# Patient Record
Sex: Female | Born: 1995 | Race: Black or African American | Hispanic: No | Marital: Single | Smoking: Never smoker
Health system: Southern US, Community
[De-identification: ages and names within clinical notes are randomized; demographics above are authoritative.]

## PROBLEM LIST (undated history)

## (undated) ENCOUNTER — Ambulatory Visit: Admission: EM | Source: Home / Self Care

## (undated) DIAGNOSIS — D649 Anemia, unspecified: Secondary | ICD-10-CM

## (undated) DIAGNOSIS — E119 Type 2 diabetes mellitus without complications: Secondary | ICD-10-CM

## (undated) DIAGNOSIS — R7303 Prediabetes: Secondary | ICD-10-CM

## (undated) DIAGNOSIS — H4710 Unspecified papilledema: Secondary | ICD-10-CM

## (undated) DIAGNOSIS — G43909 Migraine, unspecified, not intractable, without status migrainosus: Secondary | ICD-10-CM

## (undated) DIAGNOSIS — F32A Depression, unspecified: Secondary | ICD-10-CM

## (undated) DIAGNOSIS — R51 Headache: Secondary | ICD-10-CM

## (undated) DIAGNOSIS — E669 Obesity, unspecified: Secondary | ICD-10-CM

## (undated) DIAGNOSIS — F329 Major depressive disorder, single episode, unspecified: Secondary | ICD-10-CM

## (undated) HISTORY — DX: Major depressive disorder, single episode, unspecified: F32.9

## (undated) HISTORY — DX: Migraine, unspecified, not intractable, without status migrainosus: G43.909

## (undated) HISTORY — DX: Unspecified papilledema: H47.10

## (undated) HISTORY — DX: Obesity, unspecified: E66.9

## (undated) HISTORY — DX: Anemia, unspecified: D64.9

## (undated) HISTORY — DX: Depression, unspecified: F32.A

## (undated) NOTE — *Deleted (*Deleted)
 .     Contraceptive Efficacy  The chart below compares typical effectiveness of contraceptive methods, starting with the most effective methods. A great resource for communicating with patients (also available as a Government social research officer PDF here: Contraceptive Effectiveness).  Check this table: Contraceptive Failure Rates for the most up-to-date estimates of contraceptive efficacy, method by method

---
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
## 2013-02-04 ENCOUNTER — Encounter (HOSPITAL_COMMUNITY): Payer: Self-pay | Admitting: *Deleted

## 2013-02-04 ENCOUNTER — Emergency Department (INDEPENDENT_AMBULATORY_CARE_PROVIDER_SITE_OTHER)
Admission: EM | Admit: 2013-02-04 | Discharge: 2013-02-04 | Disposition: A | Discharge status: Home / Self Care | Payer: Medicaid Other | Source: Home / Self Care

## 2013-02-04 DIAGNOSIS — M778 Other enthesopathies, not elsewhere classified: Secondary | ICD-10-CM

## 2013-02-04 DIAGNOSIS — M65849 Other synovitis and tenosynovitis, unspecified hand: Secondary | ICD-10-CM

## 2013-02-04 HISTORY — DX: Type 2 diabetes mellitus without complications: E11.9

## 2013-02-04 NOTE — ED Notes (Signed)
Pt  Reports  Pain and  Numbness  r  Arm  X  3  Weeks    Also  Reports  Pain l  Arm    Today  denys  Any injury  denys  Any  Neck pain     She  Is  A  Diabetic  -  Pt  Reports  She works   2  Jobs  And  Uses  Her hands  A  Lot         She  Ambulated  To  Room  With  Steady  Fluid  Gait

## 2013-02-04 NOTE — ED Notes (Signed)
Universal  r  Wrist  Splint applied  

## 2013-02-04 NOTE — ED Provider Notes (Signed)
History     CSN: [id number]  Arrival date & time 02/04/13  1258   First MD Initiated Contact with Patient 02/04/13 1347      Chief Complaint  Patient presents with  . Extremity Pain    (Consider location/radiation/quality/duration/timing/severity/associated sxs/prior treatment) HPI Comments: 17 year old female presents with right forearm and wrist pain. She has a job that requires repetitive movement for long hours at a time. This type of movement produces and exacerbates her pain. The pain is located along the volar aspect of the wrist and the radial aspect of the hand and wrist. Denies blunt trauma or other injuries. Occasionally there is intermittent numbness distally.   Past Medical History  Diagnosis Date  . Headache   . Prediabetes   . Hypertension   . Diabetes mellitus without complication     History reviewed. No pertinent past surgical history.  Family History  Problem Relation Age of Onset  . Diabetes Mother     History  Substance Use Topics  . Smoking status: Never Smoker   . Smokeless tobacco: Not on file  . Alcohol Use: No    OB History   Grav Para Term Preterm Abortions TAB SAB Ect Mult Living                  Review of Systems  Constitutional: Negative for fever, chills and activity change.  HENT: Negative.   Respiratory: Negative.   Cardiovascular: Negative.   Musculoskeletal:       As per HPI  Skin: Negative for color change, pallor and rash.  Neurological: Positive for numbness.    Allergies  Review of patient's allergies indicates no known allergies.  Home Medications   Current Outpatient Rx  Name  Route  Sig  Dispense  Refill  . ENALAPRIL MALEATE PO   Oral   Take by mouth.         . METFORMIN HCL PO   Oral   Take by mouth.         Bertram Gala Glycol-Propyl Glycol (SYSTANE) 0.4-0.3 % SOLN   Ophthalmic   Apply 1 drop to eye 4 (four) times daily as needed.   5 mL   0     BP 101/60  Pulse 66  Temp(Src) 98.5 F (36.9  C) (Oral)  Resp 16  SpO2 100%  LMP 01/22/2013  Physical Exam  Constitutional: She is oriented to person, place, and time. She appears well-developed and well-nourished. No distress.  HENT:  Head: Normocephalic and atraumatic.  Eyes: EOM are normal. Pupils are equal, round, and reactive to light.  Neck: Normal range of motion. Neck supple.  Musculoskeletal:  Tenderness over the volar/flexor surface of the distal wrist. Palpation of the individual flexor tendons produces tenderness. Flexion of the wrist against resistance also produces pain. Positive Finkelstein's test and tenderness over the extensor pollicis longus tendon. No swelling, deformity or discoloration. Distal neurovascular and motor sensory is otherwise intact. Radial pulse 2+.  Lymphadenopathy:    She has no cervical adenopathy.  Neurological: She is alert and oriented to person, place, and time. No cranial nerve deficit.  Skin: Skin is warm and dry.  Psychiatric: She has a normal mood and affect.    ED Course  Procedures (including critical care time)  Labs Reviewed - No data to display No results found.   1. Tendinitis of right wrist   2. Tendinitis of thumb       MDM  More specifically diagnosis with flexor carpi radialis and  flexor carpi ulnaris tendinitis. Also tendinitis of the extensor pollicis longus. Wear the wrist splint for the next 2-3 weeks. Apply ice off and on several times during the day Ibuprofen 400 600 mg every 6-8 hours when necessary pain. Followup with her primary care doctor.   If you do not have one will need to locate as soon as possible for minor illnesses and health maintenance care.      Hayden Rasmussen, NP 02/04/13 1521

## 2013-02-04 NOTE — Discharge Instructions (Signed)
Extensor Pollicis Longus Tendinitis Extensor pollicis longus (EPL) tendonitis is a condition in which the EPL tendon lining (sheath) becomes inflamed. This causes pain on the thumb side (radial side) of the back of the wrist. The EPL tendon attaches the EPL muscle to the bone. The EPL muscle straightens the thumb. The tendon lining secretes a lubricant that allows the EPL to glide smoothly. When the lining becomes inflamed, the tendon can not glide smoothly, which causes pain. There are three grades of EPL tendonitis. A grade 1 strain is a mild strain, where the tendon experiences a slight pull, but no obvious tearing (microscopic tearing). There is no loss of strength, and the tendon is the correct length. Grade 2 strains are a moderate strain. There is tearing of tendon fibers within the tendon or where the tendon meets the bone or muscle. Tearing of the tendon causes the length of the whole muscle-tendon-bone unit to increase. A grade 2 injury usually results in decreased strength. A grade 3 strain is a complete rupture of the tendon.  CAUSES   EPL tendinitis is often an overuse injury and caused by repeated motions of the hand and wrist, due to friction of the tendon within the lining.  Sudden increase in activity or change in activity. SYMPTOMS   Vague, diffuse pain and tenderness over the thumb side of the back of the wrist.  Pain that gets worse when straightening the thumb.  Locking, catching, or triggering of the thumb joint.  Limited motion of the thumb.  Little or no swelling, warmth, or redness of the back of the wrist.  Crackling sound (crepitation) when the tendon or wrist is moved or touched. RISK INCREASES WITH:  Sports that involve repeated hand and wrist motions (tennis, golf, bowling).  Previous wrist injury.  Heavy labor.  Poor wrist strength and flexibility.  Failure to warm up properly before activity. PREVENTION   Warm up and stretch properly before  activity.  Allow time for adequate rest and recovery between practices and competition.  Maintain physical fitness:  Forearm, wrist, and hand flexibility.  Muscle strength and endurance.  Learn and use proper exercise technique. PROGNOSIS  This condition is often curable within 6 weeks, if treated properly with non-surgical treatment and resting the affected area. Surgery may be advised. RELATED COMPLICATIONS   Longer healing time, if not properly treated, or if not given adequate time to heal.  Chronic inflammation of the EPL tendon, causing pain.  Recurring of symptoms, especially if activity is resumed too soon.  Risks of surgery: infection, bleeding, injury to nerves (numbness of the thumb), continued pain, incomplete release of the tendon lining, recurring symptoms, cutting of the tendon, thumb weakness, thumb stiffness, and inability to straighten the thumb. TREATMENT  Treatment first involves ice and medicine to reduce pain and inflammation. Stretching and strengthening exercises, along with activity modification, may be advised to reduce painful symptoms. For certain cases, a brace, splint, or taping may be advised, to reduce wrist movement during activity. Your caregiver may recommend a corticosteroid injection to reduce inflammation. If non-surgical treatment is unsuccessful, surgery may be advised to release the inflamed tendon. If the tendon is torn, it may require repair or replacement (reconstruction). MEDICATION   If pain medicine is needed, nonsteroidal anti-inflammatory medicines, (aspirin and ibuprofen), or other minor pain relievers (acetaminophen), are often advised.  Do not take pain medicine for 7 days before surgery.  Prescription pain relievers are usually only prescribed after surgery. Use only as directed and only  as much as you need.  Cortisone injections reduce inflammation. However, these are used only in extreme cases, because there is a limit to the  number of times they may be given. These injections may weaken muscle and tendon tissue. Anesthetics also temporarily relieve pain. COLD THERAPY  Cold treatment (icing) relieves pain and reduces inflammation. Cold treatment should be applied for 10 to 15 minutes every 2 to 3 hours, and immediately after activity that aggravates your symptoms. Use ice packs or an ice massage. SEEK MEDICAL CARE IF:   Symptoms get worse or do not improve in 2 to 4 weeks, despite treatment.  You experience pain, numbness, or coldness in the hand.  Blue, gray, or dark color appears in the fingernails.  Any of the following occur after surgery: increased pain, swelling, redness, drainage of fluids, bleeding in the affected area, or signs of infection.  New, unexplained symptoms develop. (Drugs used in treatment may produce side effects.) Document Released: 10/29/2005 Document Revised: 01/21/2012 Document Reviewed: 02/10/2009 [facility] Patient Information 2013 [place], [place].  Repetitive Strain Injuries Repetitive strain injuries (RSIs) result from overuse or misuse of soft tissues including muscles, tendons, or nerves. Tendons are the cord-like structures that attach muscles to bones. RSIs can affect almost any part of the body. However, RSIs are most common in the arms (thumbs, wrists, elbows, shoulders) and legs (ankles, knees). Common medical conditions that are often caused by repetitive strain include carpal tunnel syndrome, tennis or golfer's elbow, bursitis, and tendonitis. If RSIs are treated early, and therepeated activity is reduced or removed, the severity and length of your problems can usually be reduced. RSIs are also called cumulative trauma disorders (CTD).  CAUSES  Many RSIs occur due to repeating the same activity at work over weeks or months without sufficient rest, such as prolonged typing. RSIs also commonly occur when a hobby or sport is done repeatedly without sufficient rest. RSIs can also  occur due to repeated strain or stress on a body part in someone who has one or more risk factors for RSIs. RISK FACTORS Workplace risk factors  Frequent computer use, especially if your workstation is not adjusted for your body type.  Infrequent rest breaks.  Working in a high-pressure environment.  Working at a [employer].  Repeating the same motion, such as frequent typing.  Working in an awkward position or holding the same position for a long time.  Forceful movements such as lifting, pulling, or pushing.  Vibration caused by using power tools.  Working in cold temperatures.  Job stress. Personal risk factors  Poor posture.  Being loose-jointed.  Not exercising regularly.  Being overweight.  Arthritis, diabetes, thyroid problems, or other long-term (chronic)medical conditions.  Vitamin deficiencies.  Keeping your fingernails long.  An unhealthy, stressful, or inactive lifestyle.  Not sleeping well. SYMPTOMS  Symptoms often begin at work but become more noticeable after the repeated stress has ended. For example, you may develop fatigue or soreness in your wrist while typingat work, and at night you may develop numbness and tingling in your fingers. Common symptoms include:   Burning, shooting, or aching pain, especially in the fingers, palms, wrists, forearms, or shoulders.  Tenderness.  Swelling.  Tingling, numbness, or loss of feeling.  Pain with certain activities, such as turning a doorknob or reaching above your head.  Weakness, heaviness, or loss of coordination in yourhand.  Muscle spasms or tightness. In some cases, symptoms can become so intense that it is difficult to perform everyday tasks.  Symptoms that do not improve with rest may indicate a more serious condition.  DIAGNOSIS  Your caregiver may determine the type ofRSI you have based on your medical evaluation and a description of your activities.  TREATMENT  Treatment depends on  the severity and type of RSI you have. Your caregiver may recommend rest for the affected body part, medicines, and physical or occupational therapy to reduce pain, swelling, and soreness. Discuss the activities you do repeatedly with your caregiver. Your caregiver can help you decide whether you need to change your activities. An RSI may take months or years to heal, especially if the affected body part gets insufficient rest. In some cases, such as severe carpal tunnel syndrome, surgery may be recommended. PREVENTION  Talk with your supervisor to make sure you have the proper equipment for your work station.  Maintain good posture at your desk or work station with:  Feet flat on the floor.  Knees directly over the feet, bent at a right angle.  Lower back supported by your chair or a cushion in the curve of your lower back.  Shoulders and arms relaxed and at your sides.  Neck relaxed and not bent forwards or backwards.  Your desk and computer workstation properly adjusted to your body type.  Your chair adjusted so there is no excess pressure on the back of your thighs.  The keyboard resting above your thighs. You should be able to reach the keys with your elbows at your side, bent at a right angle. Your arms should be supported on forearm rests, with your forearms parallel to the ground.  The computer mouse within easy reach.  The monitor directly in front of you, so that your eyes are aligned with the top of the screen. The screen should be about 15 to 25 inches from your eyes.  While typing, keep your wrist straight, in a neutral position. Move your entire arm when you move your mouse or when typing hard-to-reach keys.  Only use your computer as much as you need to for work. Do not use it during breaks.  Take breaks often from any repeated activity. Alternate with another task which requires you to use different muscles, or rest at least once every hour.  Change positions  regularly. If you spend a lot of time sitting, get up, walk around, and stretch.  Do not hold pens or pencils tightly when writing.  Exercise regularly.  Maintain a normal weight.  Eat a diet with plenty of vegetables, whole grains, and fruit.  Get sufficient, restful sleep. HOME CARE INSTRUCTIONS  If your caregiver prescribed medicine to help reduce swelling, take it as directed.  Only take over-the-counter or prescription medicines for pain, discomfort, or fever as directed by your caregiver.  Reduce, and if needed, stopthe activities that are causing your problems until you have no further symptoms.If your symptoms are work-related, you may need to talk to your supervisor about changing your activities.  When symptoms develop, put ice or a cold pack on the aching area.  Put ice in a plastic bag.  Place a towel between your skin and the bag.  Leave the ice on for 15 to 20 minutes.  If you were given a splint to keep your wrist from bending, wear it as instructed. It is important to wear the splint at night. Use the splint for as long as your caregiver recommends. SEEK MEDICAL CARE IF:  You develop new problems.  Your problems do not get better  with medicine. MAKE SURE YOU:  Understand these instructions.  Will watch your condition.  Will get help right away if you are not doing well or get worse. Document Released: 10/19/2002 Document Revised: 04/29/2012 Document Reviewed: 12/20/2011 [facility] Patient Information 2013 [place], [place].  Tendinitis Tendinitis is swelling and inflammation of the tendons. Tendons are band-like tissues that connect muscle to bone. Tendinitis commonly occurs in the:   Shoulders (rotator cuff).  Heels (Achilles tendon).  Elbows (triceps tendon). CAUSES Tendinitis is usually caused by overusing the tendon, muscles, and joints involved. When the tissue surrounding a tendon (synovium) becomes inflamed, it is called tenosynovitis.  Tendinitis commonly develops in people whose jobs require repetitive motions. SYMPTOMS  Pain.  Tenderness.  Mild swelling. DIAGNOSIS Tendinitis is usually diagnosed by physical exam. Your caregiver may also order X-rays or other imaging tests. TREATMENT Your caregiver may recommend certain medicines or exercises for your treatment. HOME CARE INSTRUCTIONS   Use a sling or splint for as long as directed by your caregiver until the pain decreases.  Put ice on the injured area.  Put ice in a plastic bag.  Place a towel between your skin and the bag.  Leave the ice on for 15 to 20 minutes, 3 to 4 times a day.  Avoid using the limb while the tendon is painful. Perform gentle range of motion exercises only as directed by your caregiver. Stop exercises if pain or discomfort increase, unless directed otherwise by your caregiver.  Only take over-the-counter or prescription medicines for pain, discomfort, or fever as directed by your caregiver. SEEK MEDICAL CARE IF:   Your pain and swelling increase.  You develop new, unexplained symptoms, especially increased numbness in the hands. MAKE SURE YOU:   Understand these instructions.  Will watch your condition.  Will get help right away if you are not doing well or get worse. Document Released: 10/26/2000 Document Revised: 01/21/2012 Document Reviewed: 01/15/2011 [facility] Patient Information 2013 [place], [place].  Flexor Carpi Ulnaris and [employer] Radialis Tendinitis with Rehab Tendonitis is a condition that involves inflammation of a tendon, which is a soft tissue that connects muscle to bone. The flexor carpi ulnaris (FCU) and flexor carpi radialis (FCR) tendons are vulnerable to tendonitis. The FCU and FCR are used for bending the wrist and gripping. FCU and FCR tendonitis may include inflammation of the tendon lining (sheath). The lining normally secretes fluid that allows the tendons to function smoothly, but when it is  inflamed, function is impaired. This condition may also include a tear in the FCU or FCR tendon or muscle (strain). The strain may be classified as grade 1 or 2. Grade 1 strains cause pain, but the tendon is not lengthened. Grade 2 strains include a lengthened ligament, due to the ligament being stretched or partially torn. With grade 2 strains, there is still function, although function may be decreased. SYMPTOMS   Pain, tenderness, swelling, warmth, or redness on the underside of the wrist.  Pain that gets worse when bending the wrist, especially against resistance or when turning the palm down against resistance.  Pain with gripping.  Limited motion of the wrist.  Crackling sound (crepitation) when the tendon or wrist is moved or touched.  Numbness in part of the palm of the hand. CAUSES  FCU and FCR tendonitis are caused by injury to the FCU and FCR tendons. This is often due to chronic or repeated injuries, but may also be due to severe (acute) injury. Common causes of injury include:  Strain from unusual use, overuse, increase in activity, or change in activity of the wrist, hand, or forearm.  Repeated motions of the hand and wrist, due to friction of the tendon within the lining (sheath). RISK INCREASES WITH:   Sports that involve repeated hand and wrist motions (i.e. golfing, bowling).  Sports that require gripping (i.e. tennis, golf, weightlifting).  Heavy labor.  Poor wrist and forearm strength and flexibility.  Failure to warm up properly before activity.  This condition is more common in women than in men. PREVENTION   Warm up and stretch properly before activity.  Allow the body to recover between activity.  Maintain physical fitness:  Strength, flexibility, and endurance.  Cardiovascular fitness.  Learn and use proper technique. PROGNOSIS  If treated properly, FCU and FCR tendonitis is usually curable with 6 weeks.  RELATED COMPLICATIONS   Longer  healing time, if not properly treated or if not given adequate time to heal.  Chronically inflamed tendon, causing persistent pain with activity, that may progress to constant pain, restriction of motion of the tendon within the sheath, and may potentially rupture the tendon.  Recurring symptoms, especially if activity is resumed too soon.  Risks of surgery: infection, bleeding, injury to nerves, continued pain, incomplete release of the tendon lining, recurring symptoms, cutting of the tendon, and weakness of the wrist and grip. TREATMENT  Treatment first involves the use of ice and medicine to reduce pain and inflammation Performing stretching and strengthening exercises regularly is important for a quick recovery. These exercises may be completed at home or with a therapist. Your caregiver may recommend the use of a brace or splint to reduce motions that aggravate symptoms. Corticosteroid injections may be recommended. If non-surgical treatment is unsuccessful, surgery may be necessary.  MEDICATION   If pain medicine is needed, nonsteroidal anti-inflammatory medicines (aspirin and ibuprofen), or other minor pain relievers (acetaminophen), are often advised.  Do not take pain medicine for 7 days before surgery.  Prescription pain relievers may be given if your caregiver thinks they are needed. Use only as directed and only as much as you need.  Cortisone injections may be given, to reduce inflammation. However, these injections should be reserved for serious cases, as they may only be given a certain number of times. COLD THERAPY  Cold treatment (icing) relieves pain and reduces inflammation. Cold treatment should be applied for 10 to 15 minutes every 2 to 3 hours, and immediately after activity that aggravates your symptoms. Use ice packs or an ice massage. SEEK MEDICAL CARE IF:   Symptoms get worse or do not improve in 2 weeks, despite treatment.  You experience pain, numbness, or  coldness in the hand.  Blue, gray, or dark color appears in the fingernails.  Any of the following occur after surgery: increased pain, swelling, redness, drainage of fluids, bleeding in the affected area, or signs of infection.  New, unexplained symptoms develop. (Drugs used in treatment may produce side effects.) EXERCISES  RANGE OF MOTION (ROM) AND STRETCHING EXERCISES - Flexor Carpi Ulnaris and Flexor Carpi Radialis Tendinitis These exercises may help you when beginning to recover from your injury. Your symptoms may go away with or without further involvement from your physician, physical therapist or athletic trainer. While completing these exercises, remember:   Restoring tissue flexibility helps normal motion to return to the joints. This allows healthier, less painful movement and activity.  An effective stretch should be held for at least 30 seconds.  A stretch should never  be painful. You should only feel a gentle lengthening or release in the stretched tissue. RANGE OF MOTION  Wrist Flexion, Active-Assisted  Extend your right / left elbow with your fingers pointing down.*  Gently pull the back of your hand towards you, until you feel a gentle stretch on the top of your forearm.  Hold this position for __________ seconds. Repeat __________ times. Complete this exercise __________ times per day.  *If directed by your physician, physical therapist or athletic trainer, complete this stretch with your elbow bent, rather than extended. RANGE OF MOTION  Wrist Extension, Active-Assisted  Extend your right / left elbow and turn your palm upwards.*  Gently pull your palm and fingertips back, so your wrist extends and your fingers point more toward the ground.  You should feel a gentle stretch on the inside of your forearm.  Hold this position for __________ seconds. Repeat __________ times. Complete this exercise __________ times per day. *If directed by your physician, physical  therapist or athletic trainer, complete this stretch with your elbow bent, rather than extended. STRETCH - Wrist Flexion   Place the back of your right / left hand on a tabletop, leaving your elbow slightly bent. Your fingers should point away from your body.  Gently press the back of your hand down onto the table by straightening your elbow. You should feel a stretch on the top of your forearm.  Hold this position for __________ seconds. Repeat __________ times. Complete this stretch __________ times per day.  STRETCH  Wrist Extension  Place your right / left fingertips on a tabletop leaving your elbow slightly bent. Your fingers should point backwards.  Gently press your fingers and palm down onto the table by straightening your elbow. You should feel a stretch on the inside of your forearm.  Hold this position for __________ seconds. Repeat __________ times. Complete this stretch __________ times per day.  STRENGTHENING EXERCISES - Flexor Carpi Ulnaris and Flexor Carpi Radialis Tendinitis These exercises may help you when beginning to recover from your injury. They may resolve your symptoms with or without further involvement from your physician, physical therapist or athletic trainer. While completing these exercises, remember:   Muscles can gain both the endurance and the strength needed for everyday activities through controlled exercises.  Complete these exercises as instructed by your physician, physical therapist or athletic trainer. Increase the resistance and repetitions only as guided.  You may experience muscle soreness or fatigue, but the pain or discomfort you are trying to eliminate should never worsen during these exercises. If this pain does worsen, stop and make certain you are following the directions exactly. If the pain is still present after adjustments, discontinue the exercise until you can discuss the trouble with your clinician. STRENGTH Wrist Flexors  Sit with  your right / left forearm palm-up and fully supported on a table. Your elbow should be resting below the height of your shoulder. Allow your wrist to extend over the edge of the surface.  Loosely holding a __________ weight, or holding a piece of rubber exercise band or tubing in both hands, slowly curl your hand up toward your forearm.  Hold this position for __________ seconds. Slowly lower the wrist back to the starting position, in a controlled manner. Repeat __________ times. Complete this exercise __________ times per day.  STRENGTH  Wrist Extensors  Sit with your right / left forearm palm-down and fully supported on a table. Your elbow should be resting below the height of  your shoulder Allow your wrist to extend over the edge of the surface.  Loosely holding a __________ weight, or holding a piece of rubber exercise band or tubing in both hands, slowly curl your hand up toward your forearm.  Hold this position for __________ seconds. Slowly lower the wrist back to the starting position, in a controlled manner. Repeat __________ times. Complete this exercise __________ times per day.  STRENGTH - Ulnar Deviators  Stand with a ____________________ weight in your right / left hand, or sit while holding onto a rubber exercise band or tubing in both hands, with your healthy arm supported on a table, and your injured arm below the table.  Move only your right / left wrist, so that your pinkie travels toward your forearm and your thumb moves away from your forearm.  Hold this position for __________ seconds and then slowly lower the wrist back to the starting position. Repeat __________ times. Complete this exercise __________ times per day STRENGTH - Radial Deviators  Stand with a ____________________ weight in your right / left hand, or sit while holding onto a rubber exercise band or tubing in both hands, with your right / left arm supported on a table, and your healthy arm below the  table.  Raise your right / left hand upward in front of you, so that your thumb moves toward your forearm.  Hold this position for __________ seconds and then slowly lower the wrist back to the starting position. Repeat __________ times. Complete this exercise __________ times per day. STRENGTH - Grip   Grasp a tennis ball, a dense sponge, or a large, rolled sock in your hand.  Squeeze as hard as you can, without increasing any pain.  Hold this position for __________ seconds. Release your grip slowly. Repeat __________ times. Complete this exercise __________ times per day.  Document Released: 10/29/2005 Document Revised: 01/21/2012 Document Reviewed: 02/10/2009 [facility] Patient Information 2013 [place], [place].

## 2013-02-05 NOTE — ED Provider Notes (Signed)
Medical screening examination/treatment/procedure(s) were performed by non-physician practitioner and as supervising physician I was immediately available for consultation/collaboration.   [facility]; MD  Sharin Grave, MD 02/05/13 1022

## 2013-03-03 ENCOUNTER — Encounter: Payer: Self-pay | Admitting: Obstetrics

## 2013-03-11 ENCOUNTER — Ambulatory Visit: Payer: Self-pay | Admitting: Obstetrics & Gynecology

## 2013-03-16 ENCOUNTER — Encounter: Payer: Self-pay | Admitting: Neurology

## 2013-03-16 ENCOUNTER — Ambulatory Visit (INDEPENDENT_AMBULATORY_CARE_PROVIDER_SITE_OTHER): Payer: Medicaid Other | Admitting: Neurology

## 2013-03-16 VITALS — BP 118/74 | HR 79 | Ht 65.0 in | Wt 236.0 lb

## 2013-03-16 DIAGNOSIS — H4710 Unspecified papilledema: Secondary | ICD-10-CM

## 2013-03-16 NOTE — Progress Notes (Signed)
Reason for visit: Papilledema  Melissa Chang is a 17 y.o. female  History of present illness:  Melissa Chang is an 17 year old right-handed black female with a history of obesity. The patient has borderline diabetes, and she is on metformin for this. The patient went for a routine ophthalmologic evaluation on 03/12/2013. The patient was found to have mild papilledema through retinal tomography. The patient has not had any symptoms of headache, neck stiffness, muffled hearing, or pulsatile tinnitus. The patient denies any significant issues with balance or control the bowels or the bladder. The patient does have some occasional tingling in the hands, and she reports that she is weak all over. The patient does not have any double vision or loss of vision. The patient is sent to this office for an evaluation.  Past Medical History  Diagnosis Date  . Headache   . Prediabetes   . Hypertension   . Diabetes mellitus without complication   . Papilledema     Bilateral  . Obesity     History reviewed. No pertinent past surgical history.  Family History  Problem Relation Age of Onset  . Diabetes Mother   . Diabetes Sister   . Diabetes Maternal Aunt   . Hypertension Maternal Grandmother   . Diabetes Maternal Grandmother     Social history:  reports that she has never smoked. She does not have any smokeless tobacco history on file. She reports that she does not drink alcohol. Her drug history is not on file.  Medications:  Current Outpatient Prescriptions on File Prior to Visit  Medication Sig Dispense Refill  . ENALAPRIL MALEATE PO Take by mouth.      Bertram Gala Glycol-Propyl Glycol (SYSTANE) 0.4-0.3 % SOLN Apply 1 drop to eye 4 (four) times daily as needed.  5 mL  0   No current facility-administered medications on file prior to visit.    Allergies: No Known Allergies  ROS:  Out of a complete 14 system review of symptoms, the patient complains only of the following symptoms, and  all other reviewed systems are negative.  Fatigue Blurred vision Headache Numbness, weakness Depression, anxiety, insomnia, change in appetite, racing thoughts  Blood pressure 118/74, pulse 79, height 5\' 5"  (1.651 m), weight 236 lb (107.049 kg).  Physical Exam  General: The patient is alert and cooperative at the time of the examination. The patient is moderately obese.  Head: Pupils are equal, round, and reactive to light. Discs are flat bilaterally.  Neck: The neck is supple, no carotid bruits are noted.  Respiratory: The respiratory examination is clear.  Cardiovascular: The cardiovascular examination reveals a regular rate and rhythm, no obvious murmurs or rubs are noted.  Skin: Extremities are without significant edema.  Neurologic Exam  Mental status:  Cranial nerves: Facial symmetry is present. There is good sensation of the face to pinprick and soft touch bilaterally. The strength of the facial muscles and the muscles to head turning and shoulder shrug are normal bilaterally. Speech is well enunciated, no aphasia or dysarthria is noted. Extraocular movements are full. Visual fields are full.  Motor: The motor testing reveals 5 over 5 strength of all 4 extremities. Good symmetric motor tone is noted throughout.  Sensory: Sensory testing is intact to pinprick, soft touch, vibration sensation, and position sense on all 4 extremities. No evidence of extinction is noted.  Coordination: Cerebellar testing reveals good finger-nose-finger and heel-to-shin bilaterally.  Gait and station: Gait is normal. Tandem gait is normal. Romberg is negative.  No drift is seen.  Reflexes: Deep tendon reflexes are symmetric, but are depressed bilaterally. Toes are downgoing bilaterally.   Assessment/Plan:  1. Bilateral papilledema  2. Obesity  3. Borderline diabetes  4. Bipolar disorder  Clinical examination does not show definite papilledema, but this issue was picked up on  retinal tomography. The patient may have pre-clinical pseudotumor cerebri. The patient will be sent for MRI evaluation of the brain, and if this is unremarkable, a lumbar puncture will be performed. The patient will otherwise followup in 4 months. I have advised this patient to lose weight.  Marlan Palau MD 03/16/2013 7:30 PM  [facility] [address] [place], [place] [address]  Phone [phone] Fax [phone]

## 2013-03-18 ENCOUNTER — Ambulatory Visit: Payer: Self-pay | Admitting: Obstetrics & Gynecology

## 2013-03-23 ENCOUNTER — Ambulatory Visit: Payer: Medicaid Other | Admitting: Neurology

## 2013-03-30 ENCOUNTER — Ambulatory Visit
Admission: RE | Admit: 2013-03-30 | Discharge: 2013-03-30 | Disposition: A | Discharge status: Home / Self Care | Payer: Medicaid Other | Source: Ambulatory Visit | Attending: Neurology | Admitting: Neurology

## 2013-03-30 DIAGNOSIS — H4710 Unspecified papilledema: Secondary | ICD-10-CM

## 2013-03-30 MED ORDER — GADOBENATE DIMEGLUMINE 529 MG/ML IV SOLN
20.0000 mL | Freq: Once | INTRAVENOUS | Status: AC | PRN
  Administered 2013-03-30: 20 mL via INTRAVENOUS

## 2013-04-01 ENCOUNTER — Encounter: Payer: Self-pay | Admitting: Obstetrics & Gynecology

## 2013-04-01 ENCOUNTER — Ambulatory Visit (INDEPENDENT_AMBULATORY_CARE_PROVIDER_SITE_OTHER): Payer: Medicaid Other | Admitting: Obstetrics & Gynecology

## 2013-04-01 VITALS — BP 108/69 | HR 72 | Temp 98.0°F | Ht 64.0 in | Wt 231.0 lb

## 2013-04-01 DIAGNOSIS — Z3009 Encounter for other general counseling and advice on contraception: Secondary | ICD-10-CM

## 2013-04-01 DIAGNOSIS — Z3202 Encounter for pregnancy test, result negative: Secondary | ICD-10-CM

## 2013-04-01 LAB — POCT URINE PREGNANCY: Preg Test, Ur: NEGATIVE

## 2013-04-01 MED ORDER — MEDROXYPROGESTERONE ACETATE 150 MG/ML IM SUSP: 150.0000 mg | INTRAMUSCULAR | Status: DC

## 2013-04-01 NOTE — Patient Instructions (Signed)

## 2013-04-01 NOTE — Progress Notes (Signed)
.   Subjective:     Melissa Chang is a 17 y.o. female here for a birth control consult, she is interested in Depo Provera.   No current complaints.  Personal health questionnaire reviewed: no.   Gynecologic History Patient's last menstrual period was 03/24/2013. Contraception: none Last Pap: N/A Last mammogram: N/A  Obstetric History OB History   Grav Para Term Preterm Abortions TAB SAB Ect Mult Living                   The following portions of the patient's history were reviewed and updated as appropriate: allergies, current medications, past family history, past medical history, past social history, past surgical history and problem list.  Review of Systems Pertinent items are noted in HPI.    Objective:     No exam today   Assessment:    Contraceptive counseling   Plan:    Resume Depo Provera

## 2013-04-02 ENCOUNTER — Telehealth: Payer: Self-pay | Admitting: Neurology

## 2013-04-02 DIAGNOSIS — H4710 Unspecified papilledema: Secondary | ICD-10-CM

## 2013-04-02 NOTE — Telephone Encounter (Signed)
I called patient. The MRI the brain was unremarkable. We will get her set up for a lumbar puncture.

## 2013-04-09 ENCOUNTER — Telehealth: Payer: Self-pay | Admitting: Nurse Practitioner

## 2013-04-09 ENCOUNTER — Other Ambulatory Visit: Payer: Self-pay | Admitting: Neurology

## 2013-04-09 DIAGNOSIS — G932 Benign intracranial hypertension: Secondary | ICD-10-CM

## 2013-04-09 NOTE — Telephone Encounter (Signed)
Patient mom is calling wanting to know what is an lumbar puncture and why is the patient having this done. Please call the mom and if no answer please leave her a detailed message with the answers about the LP. Please advise.

## 2013-04-09 NOTE — Telephone Encounter (Signed)
I called the mother. I went through the reasons why we are getting a spinal tap. The MRI of the brain was unremarkable, and the patient was found to have papilledema her ophthalmologist. We are looking for pseudotumor cerebri.

## 2013-04-14 ENCOUNTER — Telehealth: Payer: Self-pay | Admitting: Neurology

## 2013-04-14 ENCOUNTER — Ambulatory Visit
Admission: RE | Admit: 2013-04-14 | Discharge: 2013-04-14 | Disposition: A | Discharge status: Home / Self Care | Payer: Medicaid Other | Source: Ambulatory Visit | Attending: Neurology | Admitting: Neurology

## 2013-04-14 DIAGNOSIS — H4710 Unspecified papilledema: Secondary | ICD-10-CM

## 2013-04-14 DIAGNOSIS — G932 Benign intracranial hypertension: Secondary | ICD-10-CM

## 2013-04-14 DIAGNOSIS — Z3009 Encounter for other general counseling and advice on contraception: Secondary | ICD-10-CM

## 2013-04-14 LAB — CSF CELL COUNT WITH DIFFERENTIAL
RBC Count, CSF: 3 cu mm — ABNORMAL HIGH
WBC, CSF: 1 cu mm (ref 0–5)

## 2013-04-14 LAB — GLUCOSE, CSF: Glucose, CSF: 58 mg/dL (ref 43–76)

## 2013-04-14 MED ORDER — TOPIRAMATE 25 MG PO TABS: 25.0000 mg | ORAL_TABLET | Freq: Every day | ORAL | Status: DC

## 2013-04-14 NOTE — Telephone Encounter (Signed)
I called patient. The opening pressure for the lumbar puncture was 25. This is a mild elevation, but the patient likely had pseudotumor cerebri picked up pre-clinical state. I'll add low-dose Topamax at night. I discussed this with the patient.

## 2013-04-15 ENCOUNTER — Telehealth: Payer: Self-pay | Admitting: *Deleted

## 2013-04-15 ENCOUNTER — Telehealth: Payer: Self-pay | Admitting: Radiology

## 2013-04-15 DIAGNOSIS — G971 Other reaction to spinal and lumbar puncture: Secondary | ICD-10-CM

## 2013-04-15 LAB — CRYPTOCOCCAL ANTIGEN, CSF: Crypto Ag: NEGATIVE

## 2013-04-15 NOTE — Telephone Encounter (Signed)
Pt called [facility] and is having a positional headache from LP done yesterday.   Deb called and is asking for order for blood patch.  [phone]  Please advise.

## 2013-04-15 NOTE — Telephone Encounter (Signed)
I'll place order for the blood patch.

## 2013-04-15 NOTE — Telephone Encounter (Signed)
Pt called has had a headache since this am post LP. Pt states she has maintained bedrest and will call back in am if headache is not better. Explained that she should force fluids. Pt is to call me in am if no better. Dr. Clarisa Kindred office was called [id number] patch order.

## 2013-04-20 NOTE — Progress Notes (Signed)
Quick Note:  Called and left message spinal fluid results are good. ______

## 2013-04-22 ENCOUNTER — Ambulatory Visit (INDEPENDENT_AMBULATORY_CARE_PROVIDER_SITE_OTHER): Payer: Medicaid Other | Admitting: *Deleted

## 2013-04-22 VITALS — BP 123/77 | HR 82 | Temp 98.4°F | Wt 236.0 lb

## 2013-04-22 DIAGNOSIS — Z3202 Encounter for pregnancy test, result negative: Secondary | ICD-10-CM

## 2013-04-22 DIAGNOSIS — Z3042 Encounter for surveillance of injectable contraceptive: Secondary | ICD-10-CM

## 2013-04-22 DIAGNOSIS — Z3049 Encounter for surveillance of other contraceptives: Secondary | ICD-10-CM

## 2013-04-22 MED ORDER — MEDROXYPROGESTERONE ACETATE 150 MG/ML IM SUSP
150.0000 mg | INTRAMUSCULAR | Status: AC
  Administered 2013-04-22: 150 mg via INTRAMUSCULAR

## 2013-04-22 NOTE — Progress Notes (Signed)
Pt is in office today for a restart to her Depo. Pt is on the first day of her cycle and has had a negative pregnancy test in office. Pt given Depo and is to return to office July 14, 2013.

## 2013-06-09 ENCOUNTER — Emergency Department (HOSPITAL_COMMUNITY)
Admission: EM | Admit: 2013-06-09 | Discharge: 2013-06-09 | Disposition: A | Discharge status: Home / Self Care | Payer: Medicaid Other | Attending: Emergency Medicine | Admitting: Emergency Medicine

## 2013-06-09 ENCOUNTER — Encounter (HOSPITAL_COMMUNITY): Payer: Self-pay | Admitting: Emergency Medicine

## 2013-06-09 ENCOUNTER — Emergency Department (HOSPITAL_COMMUNITY): Payer: Medicaid Other

## 2013-06-09 DIAGNOSIS — S92919B Unspecified fracture of unspecified toe(s), initial encounter for open fracture: Secondary | ICD-10-CM | POA: Insufficient documentation

## 2013-06-09 DIAGNOSIS — I10 Essential (primary) hypertension: Secondary | ICD-10-CM | POA: Insufficient documentation

## 2013-06-09 DIAGNOSIS — Y929 Unspecified place or not applicable: Secondary | ICD-10-CM | POA: Insufficient documentation

## 2013-06-09 DIAGNOSIS — Z8669 Personal history of other diseases of the nervous system and sense organs: Secondary | ICD-10-CM | POA: Insufficient documentation

## 2013-06-09 DIAGNOSIS — X58XXXA Exposure to other specified factors, initial encounter: Secondary | ICD-10-CM | POA: Insufficient documentation

## 2013-06-09 DIAGNOSIS — Z79899 Other long term (current) drug therapy: Secondary | ICD-10-CM | POA: Insufficient documentation

## 2013-06-09 DIAGNOSIS — S92911B Unspecified fracture of right toe(s), initial encounter for open fracture: Secondary | ICD-10-CM

## 2013-06-09 DIAGNOSIS — E119 Type 2 diabetes mellitus without complications: Secondary | ICD-10-CM | POA: Insufficient documentation

## 2013-06-09 DIAGNOSIS — Y939 Activity, unspecified: Secondary | ICD-10-CM | POA: Insufficient documentation

## 2013-06-09 DIAGNOSIS — E669 Obesity, unspecified: Secondary | ICD-10-CM | POA: Insufficient documentation

## 2013-06-09 MED ORDER — HYDROCODONE-ACETAMINOPHEN 5-325 MG PO TABS: 2.0000 | ORAL_TABLET | ORAL | Status: DC | PRN

## 2013-06-09 NOTE — ED Notes (Signed)
Pt. Stated, I hurt my rt. Little toe yesterday.

## 2013-06-09 NOTE — ED Provider Notes (Signed)
CSN: [id number]     Arrival date & time 06/09/13  0704 History     First MD Initiated Contact with Patient 06/09/13 [phone]     Chief Complaint  Patient presents with  . Toe Injury    HPI Pt. Stated, I hurt my rt. Little toe yesterday.  Past Medical History  Diagnosis Date  . Headache(784.0)   . Prediabetes   . Hypertension   . Diabetes mellitus without complication   . Papilledema     Bilateral  . Obesity    History reviewed. No pertinent past surgical history. Family History  Problem Relation Age of Onset  . Diabetes Mother   . Diabetes Sister   . Diabetes Maternal Aunt   . Hypertension Maternal Grandmother   . Diabetes Maternal Grandmother    History  Substance Use Topics  . Smoking status: Never Smoker   . Smokeless tobacco: Not on file  . Alcohol Use: No   OB History   Grav Para Term Preterm Abortions TAB SAB Ect Mult Living                 Review of Systems All other systems reviewed and are negative Allergies  Review of patient's allergies indicates no known allergies.  Home Medications   Current Outpatient Rx  Name  Route  Sig  Dispense  Refill  . ENALAPRIL MALEATE PO   Oral   Take by mouth.         . metFORMIN (GLUMETZA) 500 MG (MOD) 24 hr tablet   Oral   Take 500 mg by mouth daily with breakfast.         . HYDROcodone-acetaminophen (NORCO/VICODIN) 5-325 MG per tablet   Oral   Take 2 tablets by mouth every 4 (four) hours as needed for pain.   15 tablet   0   . medroxyPROGESTERone (DEPO-PROVERA) 150 MG/ML injection   Intramuscular   Inject 1 mL (150 mg total) into the muscle every 3 (three) months.   1 mL   4    BP 128/65  Pulse 82  Temp(Src) 98.3 F (36.8 C) (Oral)  Resp 16  SpO2 99%  LMP 03/24/2013 Physical Exam  Nursing note and vitals reviewed. Constitutional: She is oriented to person, place, and time. She appears well-developed and well-nourished. No distress.  HENT:  Head: Normocephalic and atraumatic.  Eyes: Pupils  are equal, round, and reactive to light.  Neck: Normal range of motion.  Cardiovascular: Normal rate and intact distal pulses.   Pulmonary/Chest: Breath sounds normal. No respiratory distress.  Abdominal: Normal appearance. She exhibits no distension.  Musculoskeletal: Normal range of motion. She exhibits tenderness (Fifth toe on right foot).  Neurological: She is alert and oriented to person, place, and time. No cranial nerve deficit.  Skin: Skin is warm and dry. No rash noted.  Psychiatric: She has a normal mood and affect. Her behavior is normal.    ED Course   Procedures (including critical care time)  Labs Reviewed - No data to display Dg Toe 5th Right  06/09/2013   *RADIOLOGY REPORT*  Clinical Data: Cough fracture on the right fifth toe with pain  RIGHT FIFTH TOE  Comparison: None.  Findings: There is mild displaced fracture of the distal aspect of the right proximal fifth phalanx.  No radiopaque foreign body is identified.  IMPRESSION: Fracture of the distal aspect of the right fifth proximal phalanx   Original Report Authenticated By: Sherian Rein, M.D.   1. Fractured toe, right, open,  initial encounter     MDM    Nelia Shi, MD 06/09/13 [phone]

## 2013-06-09 NOTE — ED Notes (Signed)
Report to Greg,RN

## 2013-06-09 NOTE — ED Notes (Signed)
Patient transported to X-ray 

## 2013-06-09 NOTE — ED Notes (Signed)
Buddy taped patient verbalized understanding tolerated without incident,

## 2013-07-10 DIAGNOSIS — E119 Type 2 diabetes mellitus without complications: Secondary | ICD-10-CM | POA: Insufficient documentation

## 2013-07-10 DIAGNOSIS — E282 Polycystic ovarian syndrome: Secondary | ICD-10-CM | POA: Insufficient documentation

## 2013-07-10 DIAGNOSIS — Z3009 Encounter for other general counseling and advice on contraception: Secondary | ICD-10-CM | POA: Insufficient documentation

## 2013-07-14 ENCOUNTER — Ambulatory Visit: Payer: Medicaid Other

## 2013-07-16 ENCOUNTER — Telehealth: Payer: Self-pay | Admitting: *Deleted

## 2013-07-16 NOTE — Telephone Encounter (Signed)
Called patient no answer left a message for her to call the office back and confirm appt for 07-17-13

## 2013-07-17 ENCOUNTER — Ambulatory Visit (INDEPENDENT_AMBULATORY_CARE_PROVIDER_SITE_OTHER): Payer: Medicaid Other | Admitting: Nurse Practitioner

## 2013-07-17 ENCOUNTER — Encounter: Payer: Self-pay | Admitting: Nurse Practitioner

## 2013-07-17 VITALS — BP 110/67 | HR 75 | Ht 64.5 in | Wt 240.0 lb

## 2013-07-17 DIAGNOSIS — H4710 Unspecified papilledema: Secondary | ICD-10-CM

## 2013-07-17 NOTE — Progress Notes (Signed)
I have read the note, and I agree with the clinical assessment and plan.  Jniyah Dantuono KEITH   

## 2013-07-17 NOTE — Patient Instructions (Addendum)
Continue Topamax 25mg at bedtime Follow up in 6 months 

## 2013-07-17 NOTE — Progress Notes (Signed)
[facility]  PATIENT: Melissa Chang DOB: 01-30-1996   REASON FOR VISIT: Followup for papilledema  HISTORY OF PRESENT ILLNESS: Ms. Wahab is an 17 year old  right-handed black female with a history of obesity. She was initially evaluated by Dr. Anne Hahn 03/16/2013. The patient has borderline diabetes, and she is on metformin for this. The patient went for a routine ophthalmologic evaluation on 03/12/2013. The patient was found to have mild papilledema through retinal tomography. The patient has not had any symptoms of headache, neck stiffness, muffled hearing, or pulsatile tinnitus. The patient denies any significant issues with balance or control the bowels or the bladder. The patient does have some occasional tingling in the hands, and she reports that she is weak all over. The patient does not have any double vision or loss of vision. Denies any headaches. MRI of the brain with and without contrast,  orbits were notable for slightly enlarged optic nerve sheaths otherwise normal study. LP was performed on 04/14/2013 with opening pressure of 25. Patient was started on low dose Topamax by Dr. Anne Hahn. On return visit today she claims she is compliant with the medication and has not had side effects.  REVIEW OF SYSTEMS: Full 14 system review of systems performed and notable only for:  Constitutional: Weight gain, fatigue  Cardiovascular: N/A  Ear/Nose/Throat: N/A  Skin: N/A  Eyes: N/A  Respiratory: N/A  Gastroitestinal: N/A  Hematology/Lymphatic: N/A  Endocrine: N/A Musculoskeletal:N/A  Allergy/Immunology: N/A  Neurological: Dizziness, weakness Psychiatric: Change in appetite, not enough sleep  ALLERGIES: No Known Allergies  HOME MEDICATIONS: Outpatient Prescriptions Prior to Visit  Medication Sig Dispense Refill  . ENALAPRIL MALEATE PO Take by mouth.      Marland Kitchen HYDROcodone-acetaminophen (NORCO/VICODIN) 5-325 MG per tablet Take 2 tablets by mouth every 4 (four) hours as  needed for pain.  15 tablet  0  . medroxyPROGESTERone (DEPO-PROVERA) 150 MG/ML injection Inject 1 mL (150 mg total) into the muscle every 3 (three) months.  1 mL  4  . metFORMIN (GLUMETZA) 500 MG (MOD) 24 hr tablet Take 500 mg by mouth daily with breakfast.       Facility-Administered Medications Prior to Visit  Medication Dose Route Frequency Provider Last Rate Last Dose  . medroxyPROGESTERone (DEPO-PROVERA) injection 150 mg  150 mg Intramuscular Q90 days Antionette Char, MD   150 mg at 04/22/13 1651    PAST MEDICAL HISTORY: Past Medical History  Diagnosis Date  . Headache(784.0)   . Prediabetes   . Hypertension   . Diabetes mellitus without complication   . Papilledema     Bilateral  . Obesity     PAST SURGICAL HISTORY: History reviewed. No pertinent past surgical history.  FAMILY HISTORY: Family History  Problem Relation Age of Onset  . Diabetes Mother   . Diabetes Sister   . Diabetes Maternal Aunt   . Hypertension Maternal Grandmother   . Diabetes Maternal Grandmother     SOCIAL HISTORY: History   Social History  . Marital Status: Single    Spouse Name: N/A    Number of Children: N/A  . Years of Education: N/A   Occupational History  . [employer]    Social History Main Topics  . Smoking status: Never Smoker   . Smokeless tobacco: Never Used  . Alcohol Use: No  . Drug Use: No  . Sexual Activity: Yes    Birth Control/ Protection: Injection   Other Topics Concern  . Not on file   Social History Narrative  .  No narrative on file     PHYSICAL EXAM  Filed Vitals:   07/17/13 0919  BP: 110/67  Pulse: 75  Height: 5' 4.5" (1.638 m)  Weight: 240 lb (108.863 kg)   Body mass index is 40.57 kg/(m^2).  Generalized: Well developed, moderately obese female  in no acute distress  Head: normocephalic and atraumatic,. Oropharynx benign  Neck: Supple, no carotid bruits  Cardiac: Regular rate rhythm, no murmur  Musculoskeletal: No deformity   Neurological  examination   Mentation: Alert oriented to time, place, history taking. Follows all commands speech and language fluent  Cranial nerve II-XII: Fundoscopic exam with flat discs.Pupils were equal round reactive to light extraocular movements were full, visual field were full on confrontational test. Visual acuity 20/30 bil. Facial sensation and strength were normal. Hearing was intact to finger rubbing bilaterally. Uvula tongue midline. head turning and shoulder shrug and were normal and symmetric.Tongue protrusion into cheek strength was normal. Motor: normal bulk and tone, full strength in the BUE, BLE, fine finger movements normal, no pronator drift. No focal weakness Sensory: normal and symmetric to light touch, pinprick, and  vibration  Coordination: finger-nose-finger, heel-to-shin bilaterally, no dysmetria Reflexes: Depressed bilaterally but symmetric ,  plantar responses were flexor bilaterally. Gait and Station: Rising up from seated position without assistance, normal stance,  moderate stride, good arm swing, smooth turning, able to perform tiptoe, and heel walking without difficulty.   DIAGNOSTIC DATA (LABS, IMAGING, TESTING) - I reviewed patient records, labs, notes, testing and imaging myself where available.  Lab Results  Component Value Date   HGB 15.0 11/10/2012   HCT 44.0 11/10/2012      Component Value Date/Time   NA 140 11/10/2012 1841   K 4.0 11/10/2012 1841   CL 105 11/10/2012 1841   GLUCOSE 96 11/10/2012 1841   BUN 8 11/10/2012 1841   CREATININE 1.10 11/10/2012 1841    MRI of the brain with and without contrast orbits were notable for slightly enlarged optic nerve sheaths other wise normal study.  LP was performed on 04/14/2013 with opening pressure of 25.    ASSESSMENT AND PLAN  17 y.o. year old female  has a past medical history of Headache(784.0); Prediabetes; Hypertension; Diabetes mellitus without complication; Papilledema; and Obesity. She will continue  her Topamax at the current dose. She will followup in our office in 6 months      Meds ordered this encounter  Medications  . topiramate (TOPAMAX) 25 MG capsule    Sig: Take 25 mg by mouth at bedtime.    Nilda Riggs, [facility], [facility], APRN  [facility] [address], [address] [place], [place] [address] [phone]

## 2013-07-17 NOTE — Telephone Encounter (Signed)
No call back. Patient arrived.

## 2013-11-23 ENCOUNTER — Telehealth: Payer: Self-pay | Admitting: Neurology

## 2013-11-23 NOTE — Telephone Encounter (Signed)
Patient returning call about scheduling a sooner appointment, please call the patient back.

## 2013-11-23 NOTE — Telephone Encounter (Signed)
Patient called stating she was having memory problems and headaches. She has an appointment in March but wondered if she should be seen sooner. Please call to advise.

## 2013-11-23 NOTE — Telephone Encounter (Signed)
Called patient to sched sooner appt w/ Ms Gregor HamsMartin,left message with mother to call back to sched.

## 2013-11-23 NOTE — Telephone Encounter (Signed)
Called patient to sched sooner appt for headaches and explained that since physician had not seen her for memory, will need an new referral from pcp, she verbalized understanding and said she will keep existing appt. And will call back with new referral.

## 2013-11-24 NOTE — Telephone Encounter (Signed)
Called patient to offer appointment today @ 1:30 with Dr. Anne HahnWillis. Left message

## 2013-12-03 ENCOUNTER — Encounter: Payer: Self-pay | Admitting: Obstetrics & Gynecology

## 2013-12-03 ENCOUNTER — Ambulatory Visit (INDEPENDENT_AMBULATORY_CARE_PROVIDER_SITE_OTHER): Payer: Managed Care, Other (non HMO) | Admitting: Obstetrics & Gynecology

## 2013-12-03 VITALS — BP 113/76 | HR 88 | Temp 99.1°F | Ht 64.0 in | Wt 253.0 lb

## 2013-12-03 DIAGNOSIS — N760 Acute vaginitis: Secondary | ICD-10-CM

## 2013-12-03 DIAGNOSIS — A5901 Trichomonal vulvovaginitis: Secondary | ICD-10-CM

## 2013-12-03 MED ORDER — METRONIDAZOLE 500 MG PO TABS: 500.0000 mg | ORAL_TABLET | Freq: Two times a day (BID) | ORAL | Status: DC

## 2013-12-03 NOTE — Progress Notes (Signed)
Subjective:     Melissa Chang is a 18 y.o. female here for a problem exam.  Current complaints: yellowish vaginal discharge with irritation and odor.  Personal health questionnaire reviewed: yes.   Gynecologic History No LMP recorded. Patient has had an injection. Contraception: Depo-Provera injections Last Pap: N/A Last mammogram: N/A    The following portions of the patient's history were reviewed and updated as appropriate: allergies, current medications, past family history, past medical history, past social history, past surgical history and problem list.      Objective:    Pelvic: copious, thin yellow discharge  Assessment:   Likely Trichomonal vulvovaginitis   Plan:   Orders Placed This Encounter  Procedures  . WET PREP BY MOLECULAR PROBE  . POCT Wet Prep [facility]  Rx w/course of Metronidazole Return prn

## 2013-12-04 LAB — WET PREP BY MOLECULAR PROBE
CANDIDA SPECIES: NEGATIVE
Gardnerella vaginalis: NEGATIVE
TRICHOMONAS VAG: NEGATIVE

## 2013-12-04 LAB — POCT WET PREP (WET MOUNT): Clue Cells Wet Prep Whiff POC: POSITIVE

## 2013-12-04 NOTE — Patient Instructions (Signed)
Vaginitis Vaginitis is an inflammation of the vagina. It is most often caused by a change in the normal balance of the bacteria and yeast that live in the vagina. This change in balance causes an overgrowth of certain bacteria or yeast, which causes the inflammation. There are different types of vaginitis, but the most common types are:  Bacterial vaginosis.  Yeast infection (candidiasis).  Trichomoniasis vaginitis. This is a sexually transmitted infection (STI).  Viral vaginitis.  Atropic vaginitis.  Allergic vaginitis. CAUSES  The cause depends on the type of vaginitis. Vaginitis can be caused by:  Bacteria (bacterial vaginosis).  Yeast (yeast infection).  A parasite (trichomoniasis vaginitis)  A virus (viral vaginitis).  Low hormone levels (atrophic vaginitis). Low hormone levels can occur during pregnancy, breastfeeding, or after menopause.  Irritants, such as bubble baths, scented tampons, and feminine sprays (allergic vaginitis). Other factors can change the normal balance of the yeast and bacteria that live in the vagina. These include:  Antibiotic medicines.  Poor hygiene.  Diaphragms, vaginal sponges, spermicides, birth control pills, and intrauterine devices (IUD).  Sexual intercourse.  Infection.  Uncontrolled diabetes.  A weakened immune system. SYMPTOMS  Symptoms can vary depending on the cause of the vaginitis. Common symptoms include:  Abnormal vaginal discharge.  The discharge is white, gray, or yellow with bacterial vaginosis.  The discharge is thick, white, and cheesy with a yeast infection.  The discharge is frothy and yellow or greenish with trichomoniasis.  A bad vaginal odor.  The odor is fishy with bacterial vaginosis.  Vaginal itching, pain, or swelling.  Painful intercourse.  Pain or burning when urinating. Sometimes, there are no symptoms. TREATMENT  Treatment will vary depending on the type of infection.   Bacterial  vaginosis and trichomoniasis are often treated with antibiotic creams or pills.  Yeast infections are often treated with antifungal medicines, such as vaginal creams or suppositories.  Viral vaginitis has no cure, but symptoms can be treated with medicines that relieve discomfort. Your sexual partner should be treated as well.  Atrophic vaginitis may be treated with an estrogen cream, pill, suppository, or vaginal ring. If vaginal dryness occurs, lubricants and moisturizing creams may help. You may be told to avoid scented soaps, sprays, or douches.  Allergic vaginitis treatment involves quitting the use of the product that is causing the problem. Vaginal creams can be used to treat the symptoms. HOME CARE INSTRUCTIONS   Take all medicines as directed by your caregiver.  Keep your genital area clean and dry. Avoid soap and only rinse the area with water.  Avoid douching. It can remove the healthy bacteria in the vagina.  Do not use tampons or have sexual intercourse until your vaginitis has been treated. Use sanitary pads while you have vaginitis.  Wipe from front to back. This avoids the spread of bacteria from the rectum to the vagina.  Let air reach your genital area.  Wear cotton underwear to decrease moisture buildup.  Avoid wearing underwear while you sleep until your vaginitis is gone.  Avoid tight pants and underwear or nylons without a cotton panel.  Take off wet clothing (especially bathing suits) as soon as possible.  Use mild, non-scented products. Avoid using irritants, such as:  Scented feminine sprays.  Fabric softeners.  Scented detergents.  Scented tampons.  Scented soaps or bubble baths.  Practice safe sex and use condoms. Condoms may prevent the spread of trichomoniasis and viral vaginitis. SEEK MEDICAL CARE IF:   You have abdominal pain.  You   have a fever or persistent symptoms for more than 2 3 days.  You have a fever and your symptoms suddenly  get worse. Document Released: 08/26/2007 Document Revised: 07/23/2012 Document Reviewed: 04/10/2012 [employer] Patient Information 2014 [employer].  

## 2013-12-10 ENCOUNTER — Telehealth: Payer: Self-pay | Admitting: Nurse Practitioner

## 2013-12-10 ENCOUNTER — Ambulatory Visit: Payer: Managed Care, Other (non HMO) | Admitting: Nurse Practitioner

## 2013-12-10 NOTE — Telephone Encounter (Signed)
No show for scheduled appt 

## 2014-01-14 ENCOUNTER — Ambulatory Visit: Payer: Medicaid Other | Admitting: Nurse Practitioner

## 2014-02-08 DIAGNOSIS — G43909 Migraine, unspecified, not intractable, without status migrainosus: Secondary | ICD-10-CM | POA: Insufficient documentation

## 2014-02-15 ENCOUNTER — Emergency Department (HOSPITAL_COMMUNITY)
Admission: EM | Admit: 2014-02-15 | Discharge: 2014-02-16 | Disposition: A | Discharge status: Home / Self Care | Payer: Managed Care, Other (non HMO) | Attending: Emergency Medicine | Admitting: Emergency Medicine

## 2014-02-15 ENCOUNTER — Encounter (HOSPITAL_COMMUNITY): Payer: Self-pay | Admitting: Emergency Medicine

## 2014-02-15 DIAGNOSIS — E669 Obesity, unspecified: Secondary | ICD-10-CM | POA: Insufficient documentation

## 2014-02-15 DIAGNOSIS — Z79899 Other long term (current) drug therapy: Secondary | ICD-10-CM | POA: Insufficient documentation

## 2014-02-15 DIAGNOSIS — I10 Essential (primary) hypertension: Secondary | ICD-10-CM | POA: Insufficient documentation

## 2014-02-15 DIAGNOSIS — E119 Type 2 diabetes mellitus without complications: Secondary | ICD-10-CM | POA: Insufficient documentation

## 2014-02-15 DIAGNOSIS — Z8669 Personal history of other diseases of the nervous system and sense organs: Secondary | ICD-10-CM | POA: Insufficient documentation

## 2014-02-15 DIAGNOSIS — J680 Bronchitis and pneumonitis due to chemicals, gases, fumes and vapors: Secondary | ICD-10-CM | POA: Insufficient documentation

## 2014-02-15 DIAGNOSIS — R42 Dizziness and giddiness: Secondary | ICD-10-CM | POA: Insufficient documentation

## 2014-02-15 NOTE — ED Notes (Signed)
Pt. Reports spilling a "gas treatment" on chest at work and since has had SOB. Lung sounds clear. Pt. Does not appear in distress.

## 2014-02-15 NOTE — ED Notes (Signed)
Pt. reports mild SOB with occasional dry cough after accidentally inhaling spilled " gas treatment " solution at her shirt at work this evening  O2 sat = 98 % , respirations unlabored / airway intact / alert and oriented .

## 2014-02-16 ENCOUNTER — Emergency Department (HOSPITAL_COMMUNITY): Payer: Managed Care, Other (non HMO)

## 2014-02-16 LAB — BASIC METABOLIC PANEL
BUN: 15 mg/dL (ref 6–23)
CALCIUM: 9.1 mg/dL (ref 8.4–10.5)
CO2: 20 mEq/L (ref 19–32)
Chloride: 105 mEq/L (ref 96–112)
Creatinine, Ser: 0.88 mg/dL (ref 0.50–1.10)
Glucose, Bld: 86 mg/dL (ref 70–99)
POTASSIUM: 4.1 meq/L (ref 3.7–5.3)
Sodium: 140 mEq/L (ref 137–147)

## 2014-02-16 LAB — CBC WITH DIFFERENTIAL/PLATELET
BASOS PCT: 0 % (ref 0–1)
Basophils Absolute: 0 10*3/uL (ref 0.0–0.1)
EOS ABS: 0.1 10*3/uL (ref 0.0–0.7)
EOS PCT: 1 % (ref 0–5)
HCT: 41.2 % (ref 36.0–46.0)
HEMOGLOBIN: 14.5 g/dL (ref 12.0–15.0)
Lymphocytes Relative: 37 % (ref 12–46)
Lymphs Abs: 4 10*3/uL (ref 0.7–4.0)
MCH: 30.7 pg (ref 26.0–34.0)
MCHC: 35.2 g/dL (ref 30.0–36.0)
MCV: 87.3 fL (ref 78.0–100.0)
MONOS PCT: 7 % (ref 3–12)
Monocytes Absolute: 0.7 10*3/uL (ref 0.1–1.0)
NEUTROS PCT: 55 % (ref 43–77)
Neutro Abs: 6 10*3/uL (ref 1.7–7.7)
PLATELETS: 251 10*3/uL (ref 150–400)
RBC: 4.72 MIL/uL (ref 3.87–5.11)
RDW: 12 % (ref 11.5–15.5)
WBC: 10.9 10*3/uL — ABNORMAL HIGH (ref 4.0–10.5)

## 2014-02-16 NOTE — Discharge Instructions (Signed)
Follow up with your doctor in 2 days if your symptoms have not started to improve.  Please return to the ER immediately if your shortness of breath worsens or you develop associated chest pain.

## 2014-02-16 NOTE — ED Provider Notes (Signed)
CSN: [id number]     Arrival date & time 02/15/14  2128 History   First MD Initiated Contact with Patient 02/16/14 0010     Chief Complaint  Patient presents with  . Shortness of Breath     (Consider location/radiation/quality/duration/timing/severity/associated sxs/prior Treatment) HPI History provided by pt.   Pt spilled gas treatment on her shirt at 11am today and developed SOB, coughing and lightheadedness 30min later.  Changed her shirt and cleaned her skin and coughing/lightheadedness has improved, but she continues to have intermittent episodes of SOB.  Sx occur both at rest and w/ exertion.  No associated fever, nasal congestion, rhinorrhea, sore throat, CP, LE edema/pain.  Did not have cough prior to this afternoon.  H/o diabetes and HTN.  No RF for PE.   Past Medical History  Diagnosis Date  . Headache(784.0)   . Prediabetes   . Hypertension   . Diabetes mellitus without complication   . Papilledema     Bilateral  . Obesity    History reviewed. No pertinent past surgical history. Family History  Problem Relation Age of Onset  . Diabetes Mother   . Diabetes Sister   . Diabetes Maternal Aunt   . Hypertension Maternal Grandmother   . Diabetes Maternal Grandmother    History  Substance Use Topics  . Smoking status: Never Smoker   . Smokeless tobacco: Never Used  . Alcohol Use: No   OB History   Grav Para Term Preterm Abortions TAB SAB Ect Mult Living                 Review of Systems  All other systems reviewed and are negative.      Allergies  Review of patient's allergies indicates no known allergies.  Home Medications   Current Outpatient Rx  Name  Route  Sig  Dispense  Refill  . ENALAPRIL MALEATE PO   Oral   Take 5 mg by mouth at bedtime.          . metFORMIN (GLUMETZA) 500 MG (MOD) 24 hr tablet   Oral   Take 500 mg by mouth daily with breakfast.         . medroxyPROGESTERone (DEPO-PROVERA) 150 MG/ML injection   Intramuscular   Inject 1 mL  (150 mg total) into the muscle every 3 (three) months.   1 mL   4    BP 122/71  Pulse 79  Temp(Src) 98.8 F (37.1 C) (Oral)  Resp 18  Ht 5\' 4"  (1.626 m)  Wt 242 lb 5 oz (109.912 kg)  BMI 41.57 kg/m2  SpO2 100% Physical Exam  Nursing note and vitals reviewed. Constitutional: She is oriented to person, place, and time. She appears well-developed and well-nourished. No distress.  HENT:  Head: Normocephalic and atraumatic.  Eyes:  Normal appearance  Neck: Normal range of motion.  Cardiovascular: Normal rate, regular rhythm and intact distal pulses.   Pulmonary/Chest: Effort normal and breath sounds normal. No respiratory distress.  No pleuritic pain reported  Abdominal: Soft. Bowel sounds are normal. She exhibits no distension. There is no tenderness. There is no guarding.  Musculoskeletal: Normal range of motion.  No peripheral edema or calf tenderness  Neurological: She is alert and oriented to person, place, and time.  Skin: Skin is warm and dry. No rash noted.  Psychiatric: She has a normal mood and affect. Her behavior is normal.    ED Course  Procedures (including critical care time) Labs Review Labs Reviewed  CBC WITH DIFFERENTIAL -  Abnormal; Notable for the following:    WBC 10.9 (*)    All other components within normal limits  BASIC METABOLIC PANEL   Imaging Review Dg Chest 2 View  02/16/2014   CLINICAL DATA:  Shortness of breath.  EXAM: CHEST  2 VIEW  COMPARISON:  None.  FINDINGS: The lungs are well-aerated and clear. There is no evidence of focal opacification, pleural effusion or pneumothorax.  The heart is normal in size; the mediastinal contour is within normal limits. No acute osseous abnormalities are seen.  IMPRESSION: No acute cardiopulmonary process seen.   Electronically Signed   By: Roanna Raider M.D.   On: 02/16/2014 01:27     EKG Interpretation None      MDM   Final diagnoses:  Chemical pneumonitis    17yo F w/ HTN and diabetes presents w/  intermittent episodes of SOB since early yesterday afternoon.  Onset after spilling "gas treatment" on her clothing at work and initial episode associated w/ lightheadedness and cough.  No RF for or exam findings concerning for PE.  Afebrile, no coughing, nml breath sounds.  EKG and CXR pending. 12:41 AM   EKG, CXR and labs unremarkable. Results discussed w/ pt.  Suspect mild chemical pneumonitis. Nursing staff ambulated, she did not become dyspneic and maintained O2 sat at 98%.  D/c'd home.  She has a PCP to f/u with for persistent sx.  Return precautions discussed.   Otilio Miu, PA-C 02/16/14 2232

## 2014-02-20 NOTE — ED Provider Notes (Signed)
Medical screening examination/treatment/procedure(s) were performed by non-physician practitioner and as supervising physician I was immediately available for consultation/collaboration.   EKG Interpretation None        Brandt LoosenJulie Manly, MD 02/20/14 [phone]

## 2014-02-24 ENCOUNTER — Ambulatory Visit: Payer: Managed Care, Other (non HMO) | Admitting: Obstetrics & Gynecology

## 2014-03-01 ENCOUNTER — Ambulatory Visit: Payer: Managed Care, Other (non HMO) | Admitting: Obstetrics & Gynecology

## 2014-04-26 ENCOUNTER — Ambulatory Visit: Payer: Managed Care, Other (non HMO) | Admitting: Obstetrics & Gynecology

## 2014-04-30 ENCOUNTER — Ambulatory Visit: Payer: Managed Care, Other (non HMO) | Admitting: Advanced Practice Midwife

## 2014-05-03 IMAGING — CR DG TOE 5TH 2+V*R*
3 series · 3 of 3 positions shown · non-contrast
Comparison: None.

CLINICAL DATA: Cough fracture on the right fifth toe with pain

RIGHT FIFTH TOE

[t toes ap right]
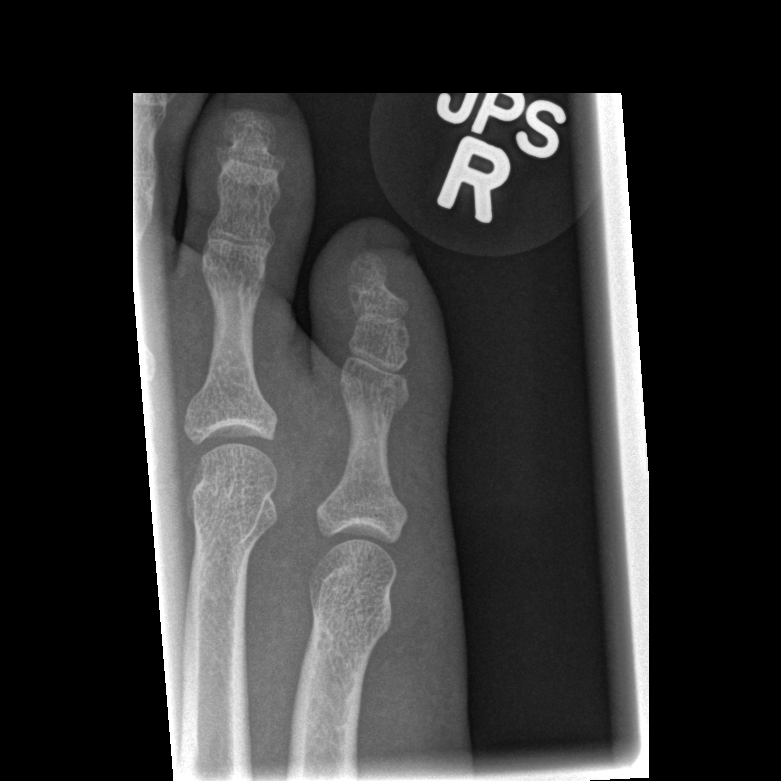

[t toes oblique right]
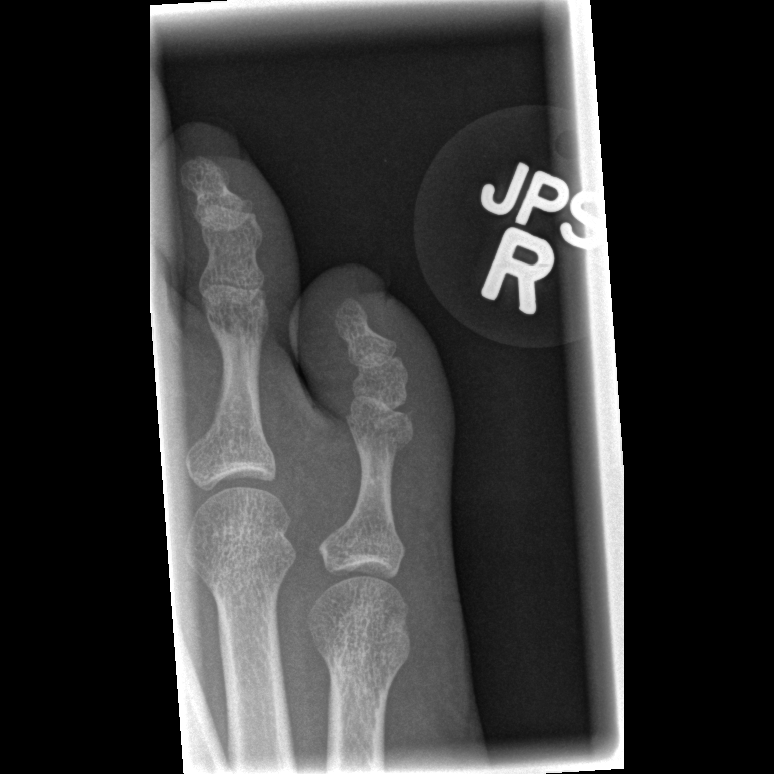

[t toes lateral right *]
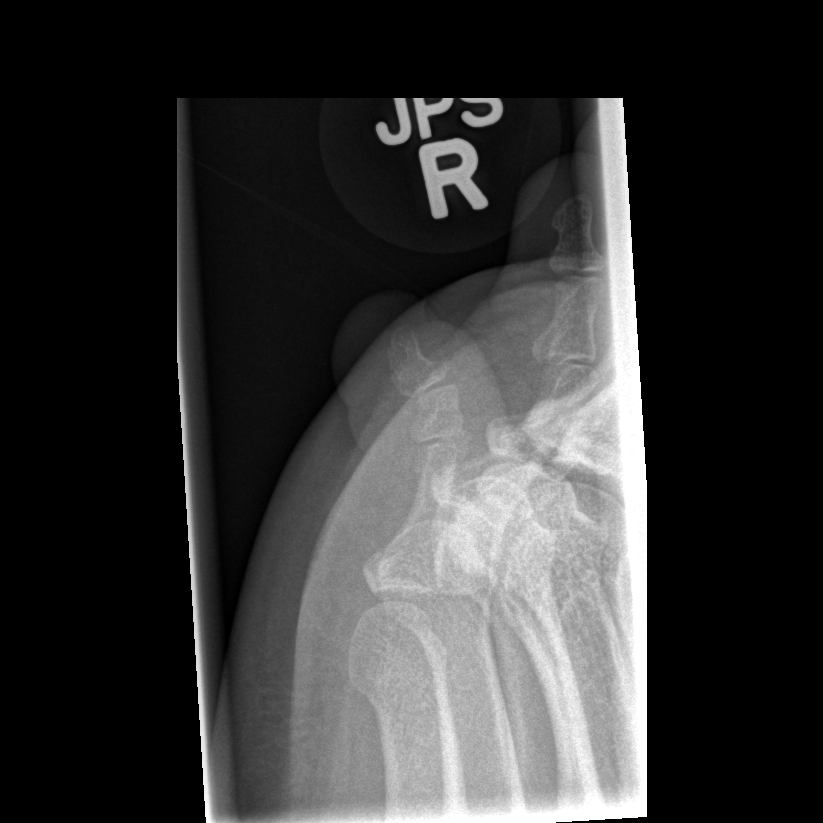

[3 of 3 positions shown; findings below may reference images not displayed]

FINDINGS: There is mild displaced fracture of the distal aspect of
the right proximal fifth phalanx.  No radiopaque foreign body is
identified.
IMPRESSION: Fracture of the distal aspect of the right fifth proximal phalanx

## 2014-08-30 ENCOUNTER — Emergency Department (INDEPENDENT_AMBULATORY_CARE_PROVIDER_SITE_OTHER)
Admission: EM | Admit: 2014-08-30 | Discharge: 2014-08-30 | Disposition: A | Discharge status: Home / Self Care | Payer: Managed Care, Other (non HMO) | Source: Home / Self Care | Attending: Family Medicine | Admitting: Family Medicine

## 2014-08-30 ENCOUNTER — Encounter (HOSPITAL_COMMUNITY): Payer: Self-pay | Admitting: Emergency Medicine

## 2014-08-30 DIAGNOSIS — R600 Localized edema: Secondary | ICD-10-CM

## 2014-08-30 DIAGNOSIS — R609 Edema, unspecified: Secondary | ICD-10-CM

## 2014-08-30 LAB — POCT I-STAT, CHEM 8
BUN: 11 mg/dL (ref 6–23)
CALCIUM ION: 1.22 mmol/L (ref 1.12–1.23)
CREATININE: 0.9 mg/dL (ref 0.50–1.10)
Chloride: 104 mEq/L (ref 96–112)
Glucose, Bld: 128 mg/dL — ABNORMAL HIGH (ref 70–99)
HCT: 46 % (ref 36.0–46.0)
HEMOGLOBIN: 15.6 g/dL — AB (ref 12.0–15.0)
Potassium: 3.6 mEq/L — ABNORMAL LOW (ref 3.7–5.3)
SODIUM: 139 meq/L (ref 137–147)
TCO2: 22 mmol/L (ref 0–100)

## 2014-08-30 MED ORDER — FUROSEMIDE 20 MG PO TABS: 20.0000 mg | ORAL_TABLET | Freq: Every day | ORAL | Status: DC

## 2014-08-30 NOTE — Discharge Instructions (Signed)
Take fluid pills as needed, reduce salt use if possible. See your doctor if further problems.

## 2014-08-30 NOTE — ED Provider Notes (Signed)
CSN: [id number]     Arrival date & time 08/30/14  1913 History   First MD Initiated Contact with Patient 08/30/14 1919     Chief Complaint  Patient presents with  . Edema   (Consider location/radiation/quality/duration/timing/severity/associated sxs/prior Treatment) Patient is a 18 y.o. female presenting with general illness. The history is provided by the patient.  Illness Location:  C/o both hands swelling, denies feet swelling, xs salt use, no menses from depo x 3 yrs. no sob. onset today. Severity:  Mild Progression:  Unchanged Chronicity:  New Associated symptoms: no abdominal pain, no chest pain, no diarrhea, no fever, no headaches and no shortness of breath     Past Medical History  Diagnosis Date  . Headache(784.0)   . Prediabetes   . Hypertension   . Diabetes mellitus without complication   . Papilledema     Bilateral  . Obesity    History reviewed. No pertinent past surgical history. Family History  Problem Relation Age of Onset  . Diabetes Mother   . Diabetes Sister   . Diabetes Maternal Aunt   . Hypertension Maternal Grandmother   . Diabetes Maternal Grandmother    History  Substance Use Topics  . Smoking status: Never Smoker   . Smokeless tobacco: Never Used  . Alcohol Use: No   OB History   Grav Para Term Preterm Abortions TAB SAB Ect Mult Living                 Review of Systems  Constitutional: Negative.  Negative for fever.  Respiratory: Negative for shortness of breath.   Cardiovascular: Negative for chest pain, palpitations and leg swelling.  Gastrointestinal: Negative for abdominal pain and diarrhea.  Neurological: Negative for headaches.    Allergies  Review of patient's allergies indicates no known allergies.  Home Medications   Prior to Admission medications   Medication Sig Start Date End Date Taking? Authorizing Provider  metFORMIN (GLUMETZA) 500 MG (MOD) 24 hr tablet Take 500 mg by mouth daily with breakfast.   Yes Historical  Provider, MD  ENALAPRIL MALEATE PO Take 5 mg by mouth at bedtime.     Historical Provider, MD  furosemide (LASIX) 20 MG tablet Take 1 tablet (20 mg total) by mouth daily. For swelling hands/ feet. 08/30/14   Linna HoffJames D Dion Sibal, MD  medroxyPROGESTERone (DEPO-PROVERA) 150 MG/ML injection Inject 1 mL (150 mg total) into the muscle every 3 (three) months. 04/01/13   Antionette CharLisa Jackson-Moore, MD   BP 124/62  Pulse 86  Temp(Src) 99.3 F (37.4 C)  SpO2 100% Physical Exam  Nursing note and vitals reviewed. Constitutional: She is oriented to person, place, and time. She appears well-developed and well-nourished. No distress.  Neck: Normal range of motion. Neck supple.  Cardiovascular: Regular rhythm, normal heart sounds and intact distal pulses.   Pulmonary/Chest: Effort normal and breath sounds normal.  Musculoskeletal: She exhibits no edema.  Neurological: She is alert and oriented to person, place, and time.  Skin: Skin is warm and dry.    ED Course  Procedures (including critical care time) Labs Review Labs Reviewed  POCT I-STAT, CHEM 8 - Abnormal; Notable for the following:    Potassium 3.6 (*)    Glucose, Bld 128 (*)    Hemoglobin 15.6 (*)    All other components within normal limits   i-stat glu 128 Imaging Review No results found.   MDM   1. Edema extremities        Linna HoffJames D Ronn Smolinsky, MD 08/30/14  1953 

## 2014-08-30 NOTE — ED Notes (Signed)
C/o both of her hands seem swollen, onset today. Denies any injury

## 2014-09-27 ENCOUNTER — Ambulatory Visit: Payer: Managed Care, Other (non HMO) | Admitting: Obstetrics & Gynecology

## 2014-11-08 ENCOUNTER — Encounter: Payer: Self-pay | Admitting: *Deleted

## 2014-11-09 ENCOUNTER — Encounter: Payer: Self-pay | Admitting: Obstetrics & Gynecology

## 2015-05-18 ENCOUNTER — Encounter: Payer: Self-pay | Admitting: Obstetrics

## 2015-05-18 ENCOUNTER — Ambulatory Visit (INDEPENDENT_AMBULATORY_CARE_PROVIDER_SITE_OTHER): Payer: Managed Care, Other (non HMO) | Admitting: Certified Nurse Midwife

## 2015-05-18 ENCOUNTER — Encounter: Payer: Self-pay | Admitting: Certified Nurse Midwife

## 2015-05-18 VITALS — BP 143/83 | HR 82 | Temp 98.8°F | Ht 64.0 in | Wt 262.0 lb

## 2015-05-18 DIAGNOSIS — A499 Bacterial infection, unspecified: Secondary | ICD-10-CM | POA: Diagnosis not present

## 2015-05-18 DIAGNOSIS — E669 Obesity, unspecified: Secondary | ICD-10-CM

## 2015-05-18 DIAGNOSIS — B9689 Other specified bacterial agents as the cause of diseases classified elsewhere: Secondary | ICD-10-CM

## 2015-05-18 DIAGNOSIS — Z30011 Encounter for initial prescription of contraceptive pills: Secondary | ICD-10-CM | POA: Diagnosis not present

## 2015-05-18 DIAGNOSIS — N760 Acute vaginitis: Secondary | ICD-10-CM | POA: Diagnosis not present

## 2015-05-18 DIAGNOSIS — L680 Hirsutism: Secondary | ICD-10-CM

## 2015-05-18 DIAGNOSIS — E282 Polycystic ovarian syndrome: Secondary | ICD-10-CM

## 2015-05-18 DIAGNOSIS — Z113 Encounter for screening for infections with a predominantly sexual mode of transmission: Secondary | ICD-10-CM | POA: Diagnosis not present

## 2015-05-18 MED ORDER — SPIRONOLACTONE-HCTZ 25-25 MG PO TABS: 1.0000 | ORAL_TABLET | Freq: Every day | ORAL | Status: DC

## 2015-05-18 MED ORDER — NORGESTIM-ETH ESTRAD TRIPHASIC 0.18/0.215/0.25 MG-35 MCG PO TABS: 1.0000 | ORAL_TABLET | Freq: Every day | ORAL | Status: DC

## 2015-05-18 MED ORDER — TINIDAZOLE 500 MG PO TABS: 2.0000 g | ORAL_TABLET | Freq: Every day | ORAL | Status: AC

## 2015-05-18 NOTE — Progress Notes (Signed)
Patient ID: Melissa Chang, female   DOB: January 03, 1996, 18 y.o.   MRN: [id number]   Subjective:     Melissa Chang is a 19 y.o. female here for a routine exam.  Current complaints: PCOS.  Has not been taking her Metformin for about 3 weeks, was making her have N&V.  Has not been on birth control.  Periods are irregular, about 5-6 months between periods.  Sexually active.  Side effects from Depo injections: HA, fatigue, weight gain.  Has not been taking her blood pressure medications either.  Is stressed about her job.  Starts back to college next week.    Personal health questionnaire:  Is patient Ashkenazi Jewish, have a family history of breast and/or ovarian cancer: no Is there a family history of uterine cancer diagnosed at age < 37, gastrointestinal cancer, urinary tract cancer, family member who is a Personnel officer syndrome-associated carrier: no Is the patient overweight and hypertensive, family history of diabetes, personal history of gestational diabetes, preeclampsia or PCOS: yes Is patient over 61, have PCOS,  family history of premature CHD under age 25, diabetes, smoke, have hypertension or peripheral artery disease:  yes At any time, has a partner hit, kicked or otherwise hurt or frightened you?: no Over the past 2 weeks, have you felt down, depressed or hopeless?: yes Over the past 2 weeks, have you felt little interest or pleasure in doing things?:no   Gynecologic History Patient's last menstrual period was 05/02/2015. Contraception: none Last Pap: N/A.  Last mammogram: N/A.   Obstetric History OB History  Gravida Para Term Preterm AB SAB TAB Ectopic Multiple Living         Past Medical History  Diagnosis Date  . Headache(784.0)   . Prediabetes   . Hypertension   . Diabetes mellitus without complication   . Papilledema     Bilateral  . Obesity     History reviewed. No pertinent past surgical history.   Current outpatient prescriptions:  .  furosemide  (LASIX) 20 MG tablet, Take 1 tablet (20 mg total) by mouth daily. For swelling hands/ feet. (Patient not taking: Reported on 05/18/2015), Disp: 30 tablet, Rfl: 0 .  metFORMIN (GLUMETZA) 500 MG (MOD) 24 hr tablet, Take 500 mg by mouth daily with breakfast., Disp: , Rfl:  .  Norgestimate-Ethinyl Estradiol Triphasic 0.18/0.215/0.25 MG-35 MCG tablet, Take 1 tablet by mouth daily., Disp: 1 Package, Rfl: 11 .  spironolactone-hydrochlorothiazide (ALDACTAZIDE) 25-25 MG per tablet, Take 1 tablet by mouth daily., Disp: 30 tablet, Rfl: 12 .  tinidazole (TINDAMAX) 500 MG tablet, Take 4 tablets (2,000 mg total) by mouth daily with breakfast., Disp: 12 tablet, Rfl: 0 No Known Allergies  History  Substance Use Topics  . Smoking status: Never Smoker   . Smokeless tobacco: Never Used  . Alcohol Use: No    Family History  Problem Relation Age of Onset  . Diabetes Mother   . Diabetes Sister   . Diabetes Maternal Aunt   . Hypertension Maternal Grandmother   . Diabetes Maternal Grandmother       Review of Systems  Constitutional: negative for fatigue and weight loss Respiratory: negative for cough and wheezing Cardiovascular: negative for chest pain, fatigue and palpitations Gastrointestinal: negative for abdominal pain and change in bowel habits Musculoskeletal:negative for myalgias Neurological: negative for gait problems and tremors Behavioral/Psych: negative for abusive relationship, + depression Endocrine: negative for temperature intolerance   Genitourinary:negative for abnormal menstrual periods, genital  lesions, hot flashes, sexual problems and vaginal discharge Integument/breast: negative for breast lump, breast tenderness, nipple discharge and skin lesion(s)    Objective:       BP 143/83 mmHg  Pulse 82  Temp(Src) 98.8 F (37.1 C)  Ht 5\' 4"  (1.626 m)  Wt 262 lb (118.842 kg)  BMI 44.95 kg/m2  LMP 05/02/2015 General:   alert  Skin:   no rash or abnormalities  Lungs:   clear to  auscultation bilaterally  Heart:   regular rate and rhythm, S1, S2 normal, no murmur, click, rub or gallop  Breasts:   deferred  Abdomen:  normal findings: no organomegaly, soft, non-tender and no hernia  Pelvis:  External genitalia: normal general appearance Urinary system: urethral meatus normal and bladder without fullness, nontender Vaginal: normal without tenderness, induration or masses, + gray thin discharge with odor Cervix: no CMT Adnexa: normal bimanual exam Uterus: anteverted and non-tender, normal size   Lab Review Urine pregnancy test Labs reviewed yes Radiologic studies reviewed yes  50% of 30 min visit spent on counseling and coordination of care.   Assessment:    Healthy female exam.   PCOS Obesity High blood pressure Pre diabetes BV Contraception counseling   Plan:    Education reviewed: depression evaluation, low fat, low cholesterol diet, safe sex/STD prevention, self breast exams, skin cancer screening and weight bearing exercise. Contraception: OCP (estrogen/progesterone). Follow up in: 6 months.   Meds ordered this encounter  Medications  . Norgestimate-Ethinyl Estradiol Triphasic 0.18/0.215/0.25 MG-35 MCG tablet    Sig: Take 1 tablet by mouth daily.    Dispense:  1 Package    Refill:  11  . spironolactone-hydrochlorothiazide (ALDACTAZIDE) 25-25 MG per tablet    Sig: Take 1 tablet by mouth daily.    Dispense:  30 tablet    Refill:  12  . tinidazole (TINDAMAX) 500 MG tablet    Sig: Take 4 tablets (2,000 mg total) by mouth daily with breakfast.    Dispense:  12 tablet    Refill:  0   Orders Placed This Encounter  Procedures  . SureSwab, Vaginosis/Vaginitis Plus  . HIV antibody (with reflex)  . Hepatitis B surface antigen  . RPR  . Hepatitis C antibody  . TSH  . Hemoglobin A1c  . CBC with Differential/Platelet  . Comprehensive metabolic panel  . Cholesterol, total  . Triglycerides  . HDL cholesterol

## 2015-05-19 LAB — COMPREHENSIVE METABOLIC PANEL
ALK PHOS: 114 U/L (ref 39–117)
ALT: 18 U/L (ref 0–35)
AST: 20 U/L (ref 0–37)
Albumin: 4.6 g/dL (ref 3.5–5.2)
BUN: 12 mg/dL (ref 6–23)
CHLORIDE: 102 meq/L (ref 96–112)
CO2: 27 meq/L (ref 19–32)
Calcium: 9.7 mg/dL (ref 8.4–10.5)
Creat: 0.8 mg/dL (ref 0.50–1.10)
Glucose, Bld: 86 mg/dL (ref 70–99)
Potassium: 4.4 mEq/L (ref 3.5–5.3)
Sodium: 138 mEq/L (ref 135–145)
Total Bilirubin: 0.4 mg/dL (ref 0.2–1.2)
Total Protein: 7.2 g/dL (ref 6.0–8.3)

## 2015-05-19 LAB — RPR

## 2015-05-19 LAB — TSH: TSH: 0.921 u[IU]/mL (ref 0.350–4.500)

## 2015-05-19 LAB — CHOLESTEROL, TOTAL: CHOLESTEROL: 191 mg/dL (ref 0–200)

## 2015-05-19 LAB — CBC WITH DIFFERENTIAL/PLATELET
BASOS ABS: 0 10*3/uL (ref 0.0–0.1)
Basophils Relative: 0 % (ref 0–1)
EOS PCT: 0 % (ref 0–5)
Eosinophils Absolute: 0 10*3/uL (ref 0.0–0.7)
HCT: 43.8 % (ref 36.0–46.0)
Hemoglobin: 14.6 g/dL (ref 12.0–15.0)
LYMPHS PCT: 27 % (ref 12–46)
Lymphs Abs: 3.3 10*3/uL (ref 0.7–4.0)
MCH: 30 pg (ref 26.0–34.0)
MCHC: 33.3 g/dL (ref 30.0–36.0)
MCV: 90.1 fL (ref 78.0–100.0)
MPV: 10 fL (ref 8.6–12.4)
Monocytes Absolute: 0.8 10*3/uL (ref 0.1–1.0)
Monocytes Relative: 7 % (ref 3–12)
Neutro Abs: 8 10*3/uL — ABNORMAL HIGH (ref 1.7–7.7)
Neutrophils Relative %: 66 % (ref 43–77)
Platelets: 304 10*3/uL (ref 150–400)
RBC: 4.86 MIL/uL (ref 3.87–5.11)
RDW: 12.7 % (ref 11.5–15.5)
WBC: 12.1 10*3/uL — ABNORMAL HIGH (ref 4.0–10.5)

## 2015-05-19 LAB — HEMOGLOBIN A1C
HEMOGLOBIN A1C: 5.9 % — AB (ref <5.7)
Mean Plasma Glucose: 123 mg/dL — ABNORMAL HIGH (ref <117)

## 2015-05-19 LAB — HEPATITIS C ANTIBODY: HCV Ab: NEGATIVE

## 2015-05-19 LAB — HEPATITIS B SURFACE ANTIGEN: Hepatitis B Surface Ag: NEGATIVE

## 2015-05-19 LAB — HIV ANTIBODY (ROUTINE TESTING W REFLEX): HIV 1&2 Ab, 4th Generation: NONREACTIVE

## 2015-05-19 LAB — TRIGLYCERIDES: Triglycerides: 133 mg/dL (ref <150)

## 2015-05-19 LAB — HDL CHOLESTEROL: HDL: 55 mg/dL (ref >=46)

## 2015-05-22 LAB — SURESWAB, VAGINOSIS/VAGINITIS PLUS
ATOPOBIUM VAGINAE: DETECTED Log (cells/mL)
BV CATEGORY: UNDETERMINED — AB
C. ALBICANS, DNA: NOT DETECTED
C. TRACHOMATIS RNA, TMA: DETECTED — AB
C. glabrata, DNA: NOT DETECTED
C. parapsilosis, DNA: NOT DETECTED
C. tropicalis, DNA: NOT DETECTED
GARDNERELLA VAGINALIS: 7.7 Log (cells/mL)
LACTOBACILLUS SPECIES: 6.3 Log (cells/mL)
MEGASPHAERA SPECIES: NOT DETECTED Log (cells/mL)
N. gonorrhoeae RNA, TMA: NOT DETECTED
T. VAGINALIS RNA, QL TMA: NOT DETECTED

## 2015-05-24 ENCOUNTER — Other Ambulatory Visit: Payer: Self-pay | Admitting: Certified Nurse Midwife

## 2015-05-24 DIAGNOSIS — B9689 Other specified bacterial agents as the cause of diseases classified elsewhere: Secondary | ICD-10-CM

## 2015-05-24 DIAGNOSIS — B373 Candidiasis of vulva and vagina: Secondary | ICD-10-CM

## 2015-05-24 DIAGNOSIS — N760 Acute vaginitis: Secondary | ICD-10-CM

## 2015-05-24 DIAGNOSIS — A749 Chlamydial infection, unspecified: Secondary | ICD-10-CM

## 2015-05-24 DIAGNOSIS — B3731 Acute candidiasis of vulva and vagina: Secondary | ICD-10-CM

## 2015-05-24 MED ORDER — FLUCONAZOLE 100 MG PO TABS: 100.0000 mg | ORAL_TABLET | Freq: Once | ORAL | Status: DC

## 2015-05-24 MED ORDER — AZITHROMYCIN 250 MG PO TABS: ORAL_TABLET | ORAL | Status: DC

## 2015-05-24 MED ORDER — TINIDAZOLE 500 MG PO TABS: 2.0000 g | ORAL_TABLET | Freq: Every day | ORAL | Status: AC

## 2015-07-07 ENCOUNTER — Telehealth: Payer: Self-pay | Admitting: *Deleted

## 2015-07-07 NOTE — Telephone Encounter (Signed)
Patient ask for call back from nurse she spoke to previously- needs to reschedule testing. 4:24 LM on VM to CB- Rosalita Chessman on Vac[employer]- LM to CB ask for Erskine Squibb or Jennings ( make sure patient is aware of glucose result)

## 2015-07-08 ENCOUNTER — Other Ambulatory Visit: Payer: Self-pay | Admitting: Certified Nurse Midwife

## 2015-07-08 DIAGNOSIS — E119 Type 2 diabetes mellitus without complications: Secondary | ICD-10-CM

## 2015-07-08 NOTE — Telephone Encounter (Signed)
Referrals were sent for both internal medicine and endocrinology to [facility].  Thank you.

## 2015-07-08 NOTE — Telephone Encounter (Signed)
Patient returned call and is requesting a referral regarding her HgA1c.

## 2015-07-12 ENCOUNTER — Telehealth: Payer: Self-pay

## 2015-07-12 NOTE — Telephone Encounter (Signed)
ATIENT HAS APPT WITH [facility] FOR PCP ON 07/22/15, AT 2PM, ALSO, [facility] HAS BEEN TRYING TO CALL HER FOR APPT AS WELL - LEFT VM FOR PATIENT TO CALL THEM AND [facility]

## 2015-07-22 ENCOUNTER — Ambulatory Visit (INDEPENDENT_AMBULATORY_CARE_PROVIDER_SITE_OTHER): Payer: Managed Care, Other (non HMO) | Admitting: Family

## 2015-07-22 ENCOUNTER — Other Ambulatory Visit (INDEPENDENT_AMBULATORY_CARE_PROVIDER_SITE_OTHER): Payer: Managed Care, Other (non HMO)

## 2015-07-22 ENCOUNTER — Encounter: Payer: Self-pay | Admitting: Family

## 2015-07-22 DIAGNOSIS — E119 Type 2 diabetes mellitus without complications: Secondary | ICD-10-CM | POA: Diagnosis not present

## 2015-07-22 DIAGNOSIS — R7309 Other abnormal glucose: Secondary | ICD-10-CM | POA: Diagnosis not present

## 2015-07-22 DIAGNOSIS — R7303 Prediabetes: Secondary | ICD-10-CM

## 2015-07-22 LAB — URINALYSIS, ROUTINE W REFLEX MICROSCOPIC
Bilirubin Urine: NEGATIVE
Ketones, ur: NEGATIVE
Leukocytes, UA: NEGATIVE
Nitrite: NEGATIVE
Urine Glucose: NEGATIVE
Urobilinogen, UA: 0.2 (ref 0.0–1.0)
pH: 6 (ref 5.0–8.0)

## 2015-07-22 LAB — HEMOGLOBIN A1C: Hgb A1c MFr Bld: 5.6 % (ref 4.6–6.5)

## 2015-07-22 NOTE — Patient Instructions (Addendum)
Thank you for choosing [facility].  Summary/Instructions:  Please stop by the lab on the basement level of the building for your blood work. Your results will be released to MyChart (or called to you) after review, usually within 72 hours after test completion. If any changes need to be made, you will be notified at that same time.  If your symptoms worsen or fail to improve, please contact our office for further instruction, or in case of emergency go directly to the emergency room at the closest medical facility.     Exercise to Lose Weight Exercise and a healthy diet may help you lose weight. Your doctor may suggest specific exercises. EXERCISE IDEAS AND TIPS  Choose low-cost things you enjoy doing, such as walking, bicycling, or exercising to workout videos.  Take stairs instead of the elevator.  Walk during your lunch break.  Park your car further away from work or school.  Go to a gym or an exercise class.  Start with 5 to 10 minutes of exercise each day. Build up to 30 minutes of exercise 4 to 6 days a week.  Wear shoes with good support and comfortable clothes.  Stretch before and after working out.  Work out until you breathe harder and your heart beats faster.  Drink extra water when you exercise.  Do not do so much that you hurt yourself, feel dizzy, or get very short of breath. Exercises that burn about 150 calories:  Running 1  miles in 15 minutes.  Playing volleyball for 45 to 60 minutes.  Washing and waxing a car for 45 to 60 minutes.  Playing touch football for 45 minutes.  Walking 1  miles in 35 minutes.  Pushing a stroller 1  miles in 30 minutes.  Playing basketball for 30 minutes.  Raking leaves for 30 minutes.  Bicycling 5 miles in 30 minutes.  Walking 2 miles in 30 minutes.  Dancing for 30 minutes.  Shoveling snow for 15 minutes.  Swimming laps for 20 minutes.  Walking up stairs for 15 minutes.  Bicycling 4 miles in 15  minutes.  Gardening for 30 to 45 minutes.  Jumping rope for 15 minutes.  Washing windows or floors for 45 to 60 minutes. Document Released: 12/01/2010 Document Revised: 01/21/2012 Document Reviewed: 12/01/2010 [facility] Patient Information 2015 [place], [place]. This information is not intended to replace advice given to you by your health care provider. Make sure you discuss any questions you have with your health care provider.

## 2015-07-22 NOTE — Progress Notes (Signed)
   Subjective:    Patient ID: Melissa Chang, female    DOB: September 23, 1996, 18 y.o.   MRN: [id number]  Chief Complaint  Patient presents with  . Establish Care    went to the dr a month and was told that her Bennie Pierini were too high so they sent her here to get it checked out    HPI:  Melissa Chang is a 19 y.o. female with a PMH of PCOS, Type 2 diabetes, BV, and chlamydia  who presents today for an office visit to establish care.   1.) Type 2 diabetes - Recently seen for annual woman's exam and found to have an elevated A1c. Does describe occasional tingling in her toes. Denies excessive hunger, thirst or urination. 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 Exercise consists of dancing and walking while at her 2nd job. Previous notes reveal diagnosis of diabetes in 2012, however unable to see lab work to confirm.   Lab Results  Component Value Date   HGBA1C 5.6 07/22/2015    No Known Allergies  No current outpatient prescriptions on file prior to visit.   No current facility-administered medications on file prior to visit.    Review of Systems  Eyes:       Denies changes in vision  Endocrine: Negative for polydipsia, polyphagia and polyuria.  Neurological: Negative for numbness.      Objective:    BP 124/70 mmHg  Pulse 78  Temp(Src) 98.6 F (37 C) (Oral)  Resp 18  Ht  (1.626 m)  Wt 256 lb (116.121 kg)  BMI 43.92 kg/m2  SpO2 97% Nursing note and vital signs reviewed.  Physical Exam  Constitutional: She is oriented to person, place, and time. She appears well-developed and well-nourished. No distress.  Cardiovascular: Normal rate, regular rhythm, normal heart sounds and intact distal pulses.   Pulmonary/Chest: Effort normal and breath sounds normal.  Neurological: She is alert and oriented to person, place, and time.  Skin: Skin is warm and dry.  Psychiatric: She has a normal mood and affect.  Her behavior is normal. Judgment and thought content normal.       Assessment & Plan:   Problem List Items Addressed This Visit      Endocrine   Diabetes mellitus, type 2    Previous diagnosis of diabetes, however appears well controlled with lifestyle management. Obtain A1c and UA to determine current blood sugar levels. Discussed importance of nutrition and exercise to assist with maintaining blood sugar. Follow up in 3 months for new A1c.      Relevant Orders   Hemoglobin A1c (Completed)   Urinalysis

## 2015-07-22 NOTE — Assessment & Plan Note (Signed)
Previous diagnosis of diabetes, however appears well controlled with lifestyle management. Obtain A1c and UA to determine current blood sugar levels. Discussed importance of nutrition and exercise to assist with maintaining blood sugar. Follow up in 3 months for new A1c.

## 2015-07-22 NOTE — Progress Notes (Signed)
Pre visit review using our clinic review tool, if applicable. No additional management support is needed unless otherwise documented below in the visit note. 

## 2015-07-24 ENCOUNTER — Encounter: Payer: Self-pay | Admitting: Family

## 2015-08-09 ENCOUNTER — Encounter: Payer: Self-pay | Admitting: Endocrinology

## 2015-08-15 ENCOUNTER — Encounter: Payer: Self-pay | Admitting: Obstetrics

## 2015-08-15 ENCOUNTER — Ambulatory Visit (INDEPENDENT_AMBULATORY_CARE_PROVIDER_SITE_OTHER): Payer: Managed Care, Other (non HMO) | Admitting: Obstetrics

## 2015-08-15 VITALS — BP 129/77 | HR 78 | Wt 257.0 lb

## 2015-08-15 DIAGNOSIS — Z3041 Encounter for surveillance of contraceptive pills: Secondary | ICD-10-CM

## 2015-08-15 DIAGNOSIS — Z01419 Encounter for gynecological examination (general) (routine) without abnormal findings: Secondary | ICD-10-CM

## 2015-08-15 NOTE — Progress Notes (Signed)
Subjective:        Melissa Chang is a 19 y.o. female here for a routine exam.  Current complaints: Irregular vaginal bleeding with OCP.  Just started on the pill this past month.    Personal health questionnaire:  Is patient Ashkenazi Jewish, have a family history of breast and/or ovarian cancer: no Is there a family history of uterine cancer diagnosed at age < 40, gastrointestinal cancer, urinary tract cancer, family member who is a Personnel officer syndrome-associated carrier: no Is the patient overweight and hypertensive, family history of diabetes, personal history of gestational diabetes, preeclampsia or PCOS: no Is patient over 58, have PCOS,  family history of premature CHD under age 68, diabetes, smoke, have hypertension or peripheral artery disease:  no At any time, has a partner hit, kicked or otherwise hurt or frightened you?: no Over the past 2 weeks, have you felt down, depressed or hopeless?: no Over the past 2 weeks, have you felt little interest or pleasure in doing things?:no   Gynecologic History Patient's last menstrual period was 07/20/2015. Contraception: OCP (estrogen/progesterone) Last Pap: None. Results were: None Last mammogram: n/a. Results were: n/a  Obstetric History OB History  Gravida Para Term Preterm AB SAB TAB Ectopic Multiple Living         Past Medical History  Diagnosis Date  . Headache(784.0)   . Prediabetes   . Hypertension   . Diabetes mellitus without complication (HCC)   . Papilledema     Bilateral  . Obesity   . Depression   . Migraine     History reviewed. No pertinent past surgical history.  No current outpatient prescriptions on file. No Known Allergies  Social History  Substance Use Topics  . Smoking status: Never Smoker   . Smokeless tobacco: Never Used  . Alcohol Use: No    Family History  Problem Relation Age of Onset  . Diabetes Mother   . Diabetes Sister   . Diabetes Maternal Aunt   . Hypertension  Maternal Grandmother   . Diabetes Maternal Grandmother       Review of Systems  Constitutional: negative for fatigue and weight loss Respiratory: negative for cough and wheezing Cardiovascular: negative for chest pain, fatigue and palpitations Gastrointestinal: negative for abdominal pain and change in bowel habits Musculoskeletal:negative for myalgias Neurological: negative for gait problems and tremors Behavioral/Psych: negative for abusive relationship, depression Endocrine: negative for temperature intolerance   Genitourinary:negative for abnormal menstrual periods, genital lesions, hot flashes, sexual problems and vaginal discharge Integument/breast: negative for breast lump, breast tenderness, nipple discharge and skin lesion(s)    Objective:       BP 129/77 mmHg  Pulse 78  Wt 257 lb (116.574 kg)  LMP 07/20/2015 General:   alert  Skin:   no rash or abnormalities  Lungs:   clear to auscultation bilaterally  Heart:   regular rate and rhythm, S1, S2 normal, no murmur, click, rub or gallop  Breasts:   normal without suspicious masses, skin or nipple changes or axillary nodes  Abdomen:  normal findings: no organomegaly, soft, non-tender and no hernia  Pelvis:  External genitalia: normal general appearance Urinary system: urethral meatus normal and bladder without fullness, nontender Vaginal: normal without tenderness, induration or masses Cervix: normal appearance Adnexa: normal bimanual exam Uterus: anteverted and non-tender, normal size   Lab Review Urine pregnancy test Labs reviewed yes Radiologic studies reviewed no    Assessment:  Healthy female exam.    Contraceptive management.   Plan:   Continue OCP's   Education reviewed: low fat, low cholesterol diet, safe sex/STD prevention and weight bearing exercise. Contraception: OCP (estrogen/progesterone). Follow up in: 4 months.  OCP surveillance.   No orders of the defined types were placed in this  encounter.   Orders Placed This Encounter  Procedures  . SureSwab, Vaginosis/Vaginitis Plus

## 2015-08-16 LAB — PAP IG W/ RFLX HPV ASCU

## 2015-08-18 ENCOUNTER — Ambulatory Visit: Payer: Managed Care, Other (non HMO) | Admitting: Endocrinology

## 2015-08-18 LAB — SURESWAB, VAGINOSIS/VAGINITIS PLUS
ATOPOBIUM VAGINAE: 6.5 Log (cells/mL)
C. PARAPSILOSIS, DNA: NOT DETECTED
C. TRACHOMATIS RNA, TMA: NOT DETECTED
C. TROPICALIS, DNA: NOT DETECTED
C. albicans, DNA: NOT DETECTED
C. glabrata, DNA: NOT DETECTED
Gardnerella vaginalis: 7.3 Log (cells/mL)
LACTOBACILLUS SPECIES: NOT DETECTED Log (cells/mL)
MEGASPHAERA SPECIES: 7.1 Log (cells/mL)
N. GONORRHOEAE RNA, TMA: NOT DETECTED
T. VAGINALIS RNA, QL TMA: NOT DETECTED

## 2015-08-19 ENCOUNTER — Other Ambulatory Visit: Payer: Self-pay | Admitting: Obstetrics

## 2015-08-19 DIAGNOSIS — B9689 Other specified bacterial agents as the cause of diseases classified elsewhere: Secondary | ICD-10-CM

## 2015-08-19 DIAGNOSIS — N760 Acute vaginitis: Principal | ICD-10-CM

## 2015-08-19 MED ORDER — METRONIDAZOLE 500 MG PO TABS: 500.0000 mg | ORAL_TABLET | Freq: Two times a day (BID) | ORAL | Status: DC

## 2015-08-22 ENCOUNTER — Encounter: Payer: Self-pay | Admitting: *Deleted

## 2015-08-22 ENCOUNTER — Telehealth: Payer: Self-pay | Admitting: *Deleted

## 2015-08-22 NOTE — Telephone Encounter (Signed)
Pt returned call to office regarding lab results.  Return call to pt.  Pt made aware of labs and that Dr Clearance Coots has sent Rx to her pharmacy.  Pt advised to call office for any other concerns.

## 2015-08-23 ENCOUNTER — Ambulatory Visit: Payer: Managed Care, Other (non HMO) | Admitting: Endocrinology

## 2015-08-30 ENCOUNTER — Ambulatory Visit: Payer: Managed Care, Other (non HMO) | Admitting: Endocrinology

## 2015-08-30 ENCOUNTER — Encounter (HOSPITAL_COMMUNITY): Payer: Self-pay | Admitting: Emergency Medicine

## 2015-08-30 ENCOUNTER — Emergency Department (INDEPENDENT_AMBULATORY_CARE_PROVIDER_SITE_OTHER)
Admission: EM | Admit: 2015-08-30 | Discharge: 2015-08-30 | Disposition: A | Discharge status: Home / Self Care | Payer: Managed Care, Other (non HMO) | Source: Home / Self Care | Attending: Emergency Medicine | Admitting: Emergency Medicine

## 2015-08-30 DIAGNOSIS — Z0289 Encounter for other administrative examinations: Secondary | ICD-10-CM

## 2015-08-30 DIAGNOSIS — T148XXA Other injury of unspecified body region, initial encounter: Secondary | ICD-10-CM

## 2015-08-30 DIAGNOSIS — M545 Low back pain, unspecified: Secondary | ICD-10-CM

## 2015-08-30 DIAGNOSIS — T148 Other injury of unspecified body region: Secondary | ICD-10-CM | POA: Diagnosis not present

## 2015-08-30 DIAGNOSIS — S76219A Strain of adductor muscle, fascia and tendon of unspecified thigh, initial encounter: Secondary | ICD-10-CM

## 2015-08-30 DIAGNOSIS — S39011A Strain of muscle, fascia and tendon of abdomen, initial encounter: Secondary | ICD-10-CM | POA: Diagnosis not present

## 2015-08-30 DIAGNOSIS — N390 Urinary tract infection, site not specified: Secondary | ICD-10-CM

## 2015-08-30 LAB — POCT URINALYSIS DIP (DEVICE)
BILIRUBIN URINE: NEGATIVE
GLUCOSE, UA: NEGATIVE mg/dL
KETONES UR: NEGATIVE mg/dL
Nitrite: NEGATIVE
Protein, ur: NEGATIVE mg/dL
SPECIFIC GRAVITY, URINE: 1.025 (ref 1.005–1.030)
Urobilinogen, UA: 0.2 mg/dL (ref 0.0–1.0)
pH: 6 (ref 5.0–8.0)

## 2015-08-30 LAB — POCT PREGNANCY, URINE: Preg Test, Ur: NEGATIVE

## 2015-08-30 MED ORDER — CEPHALEXIN 500 MG PO CAPS: 500.0000 mg | ORAL_CAPSULE | Freq: Four times a day (QID) | ORAL | Status: DC

## 2015-08-30 MED ORDER — DICLOFENAC POTASSIUM 50 MG PO TABS: 50.0000 mg | ORAL_TABLET | Freq: Three times a day (TID) | ORAL | Status: DC

## 2015-08-30 NOTE — Discharge Instructions (Signed)
Groin Strain A groin strain (also called a groin pull) is an injury to the muscles or tendon on the upper inner part of the thigh. These muscles are called the adductor muscles or groin muscles. They are responsible for moving the leg across the body. A muscle strain occurs when a muscle is overstretched and some muscle fibers are torn. A groin strain can range from mild to severe depending on how many muscle fibers are affected and whether the muscle fibers are partially or completely torn.  Groin strains usually occur during exercise or participation in sports. The injury often happens when a sudden, violent force is placed on a muscle, stretching the muscle too far. A strain is more likely to occur when your muscles are not warmed up or if you are not properly conditioned. Depending on the severity of the groin strain, recovery time may vary from a few weeks to several weeks. Severe injuries often require 4-6 weeks for recovery. In these cases, complete healing can take 4-5 months.  CAUSES   Stretching the groin muscles too far or too suddenly, often during side-to-side motion with an abrupt change in direction.  Putting repeated stress on the groin muscles over a long period of time.  Performing vigorous activity without properly stretching the groin muscles beforehand. SYMPTOMS   Pain and tenderness in the groin area. This begins as sharp pain and persists as a dull ache.  Popping or snapping feeling when the injury occurs (for severe strains).  Swelling or bruising.  Muscle spasms.  Weakness in the leg.  Stiffness in the groin area with decreased ability to move the affected muscles. DIAGNOSIS  Your caregiver will perform a physical exam to diagnose a groin strain. You will be asked about your symptoms and how the injury occurred. X-rays are sometimes needed to rule out a broken bone or cartilage problems. Your caregiver may order a CT scan or MRI if a complete muscle tear is  suspected. TREATMENT  A groin strain will often heal on its own. Your caregiver may prescribe medicines to help manage pain and swelling (anti-inflammatory medicine). You may be told to use crutches for the first few days to minimize your pain. HOME CARE INSTRUCTIONS   Rest. Do not use the strained muscle if it causes pain.  Put ice on the injured area.  Put ice in a plastic bag.  Place a towel between your skin and the bag.  Leave the ice on for 15-20 minutes, every 2-3 hours. Do this for the first 2 days after the injury.  Only take over-the-counter or prescription medicines as directed by your caregiver.  Wrap the injured area with an elastic bandage as directed by your caregiver.  Keep the injured leg raised (elevated).  Walk, stretch, and perform range-of-motion exercises to improve blood flow to the injured area. Only perform these activities if you can do so without any pain. To prevent muscle strains:  Warm up before exercise.  Develop proper conditioning and strength in the groin muscles. SEEK IMMEDIATE MEDICAL CARE IF:   You have increased pain or swelling in the affected area.   Your symptoms are not improving or are getting worse. MAKE SURE YOU:   Understand these instructions.  Will watch your condition.  Will get help right away if you are not doing well or get worse.   This information is not intended to replace advice given to you by your health care provider. Make sure you discuss any questions you have  with your health care provider.   Document Released: 06/26/2004 Document Revised: 10/15/2012 Document Reviewed: 07/02/2012 Elsevier Interactive Patient Education 2016 Else[employer] Inc[employer]  Back Pain, Adult Back pain is very common in adults.The cause of back pain is rarely dangerous and the pain often gets better over time.The cause of your back pain may not be known. Some common causes of back pain include:  Strain of the muscles or ligaments supporting  the spine.  Wear and tear (degeneration) of the spinal disks.  Arthritis.  Direct injury to the back. For many people, back pain may return. Since back pain is rarely dangerous, most people can learn to manage this condition on their own. HOME CARE INSTRUCTIONS Watch your back pain for any changes. The following actions may help to lessen any discomfort you are feeling:  Remain active. It is stressful on your back to sit or stand in one place for long periods of time. Do not sit, drive, or stand in one place for more than 30 minutes at a time. Take short walks on even surfaces as soon as you are able.Try to increase the length of time you walk each day.  Exercise regularly as directed by your health care provider. Exercise helps your back heal faster. It also helps avoid future injury by keeping your muscles strong and flexible.  Do not stay in bed.Resting more than 1-2 days can delay your recovery.  Pay attention to your body when you bend and lift. The most comfortable positions are those that put less stress on your recovering back. Always use proper lifting techniques, including:  Bending your knees.  Keeping the load close to your body.  Avoiding twisting.  Find a comfortable position to sleep. Use a firm mattress and lie on your side with your knees slightly bent. If you lie on your back, put a pillow under your knees.  Avoid feeling anxious or stressed.Stress increases muscle tension and can worsen back pain.It is important to recognize when you are anxious or stressed and learn ways to manage it, such as with exercise.  Take medicines only as directed by your health care provider. Over-the-counter medicines to reduce pain and inflammation are often the most helpful.Your health care provider may prescribe muscle relaxant drugs.These medicines help dull your pain so you can more quickly return to your normal activities and healthy exercise.  Apply ice to the injured  area:  Put ice in a plastic bag.  Place a towel between your skin and the bag.  Leave the ice on for 20 minutes, 2-3 times a day for the first 2-3 days. After that, ice and heat may be alternated to reduce pain and spasms.  Maintain a healthy weight. Excess weight puts extra stress on your back and makes it difficult to maintain good posture. SEEK MEDICAL CARE IF:  You have pain that is not relieved with rest or medicine.  You have increasing pain going down into the legs or buttocks.  You have pain that does not improve in one week.  You have night pain.  You lose weight.  You have a fever or chills. SEEK IMMEDIATE MEDICAL CARE IF:   You develop new bowel or bladder control problems.  You have unusual weakness or numbness in your arms or legs.  You develop nausea or vomiting.  You develop abdominal pain.  You feel faint.   This information is not intended to replace advice given to you by your health care provider. Make sure you discuss any  questions you have with your health care provider.   Document Released: 10/29/2005 Document Revised: 11/19/2014 Document Reviewed: 03/02/2014 Elsevier Interactive Patient Education 2016 Elsevier Inc.  Dysuria Dysuria is pain or discomfort while urinating. The pain or discomfort may be felt in the tube that carries urine out of the bladder (urethra) or in the surrounding tissue of the genitals. The pain may also be felt in the groin area, lower abdomen, and lower back. You may have to urinate frequently or have the sudden feeling that you have to urinate (urgency). Dysuria can affect both men and women, but is more common in women. Dysuria can be caused by many different things, including:  Urinary tract infection in women.  Infection of the kidney or bladder.  Kidney stones or bladder stones.  Certain sexually transmitted infections (STIs), such as chlamydia.  Dehydration.  Inflammation of the vagina.  Use of certain  medicines.  Use of certain soaps or scented products that cause irritation. HOME CARE INSTRUCTIONS Watch your dysuria for any changes. The following actions may help to reduce any discomfort you are feeling:  Drink enough fluid to keep your urine clear or pale yellow.  Empty your bladder often. Avoid holding urine for long periods of time.  After a bowel movement or urination, women should cleanse from front to back, using each tissue only once.  Empty your bladder after sexual intercourse.  Take medicines only as directed by your health care provider.  If you were prescribed an antibiotic medicine, finish it all even if you start to feel better.  Avoid caffeine, tea, and alcohol. They can irritate the bladder and make dysuria worse. In men, alcohol may irritate the prostate.  Keep all follow-up visits as directed by your health care provider. This is important.  If you had any tests done to find the cause of dysuria, it is your responsibility to obtain your test results. Ask the lab or department performing the test when and how you will get your results. Talk with your health care provider if you have any questions about your results. SEEK MEDICAL CARE IF:  You develop pain in your back or sides.  You have a fever.  You have nausea or vomiting.  You have blood in your urine.  You are not urinating as often as you usually do. SEEK IMMEDIATE MEDICAL CARE IF:  You pain is severe and not relieved with medicines.  You are unable to hold down any fluids.  You or someone else notices a change in your mental function.  You have a rapid heartbeat at rest.  You have shaking or chills.  You feel extremely weak.   This information is not intended to replace advice given to you by your health care provider. Make sure you discuss any questions you have with your health care provider.   Document Released: 07/27/2004 Document Revised: 11/19/2014 Document Reviewed:  06/24/2014 Elsevier Interactive Patient Education 2016 Elsevier Inc.  Muscle Strain A muscle strain is an injury that occurs when a muscle is stretched beyond its normal length. Usually a small number of muscle fibers are torn when this happens. Muscle strain is rated in degrees. First-degree strains have the least amount of muscle fiber tearing and pain. Second-degree and third-degree strains have increasingly more tearing and pain.  Usually, recovery from muscle strain takes 1-2 weeks. Complete healing takes 5-6 weeks.  CAUSES  Muscle strain happens when a sudden, violent force placed on a muscle stretches it too far. This may occur  with lifting, sports, or a fall.  RISK FACTORS Muscle strain is especially common in athletes.  SIGNS AND SYMPTOMS At the site of the muscle strain, there may be:  Pain.  Bruising.  Swelling.  Difficulty using the muscle due to pain or lack of normal function. DIAGNOSIS  Your health care provider will perform a physical exam and ask about your medical history. TREATMENT  Often, the best treatment for a muscle strain is resting, icing, and applying cold compresses to the injured area.  HOME CARE INSTRUCTIONS   Use the PRICE method of treatment to promote muscle healing during the first 2-3 days after your injury. The PRICE method involves:  Protecting the muscle from being injured again.  Restricting your activity and resting the injured body part.  Icing your injury. To do this, put ice in a plastic bag. Place a towel between your skin and the bag. Then, apply the ice and leave it on from 15-20 minutes each hour. After the third day, switch to moist heat packs.  Apply compression to the injured area with a splint or elastic bandage. Be careful not to wrap it too tightly. This may interfere with blood circulation or increase swelling.  Elevate the injured body part above the level of your heart as often as you can.  Only take over-the-counter or  prescription medicines for pain, discomfort, or fever as directed by your health care provider.  Warming up prior to exercise helps to prevent future muscle strains. SEEK MEDICAL CARE IF:   You have increasing pain or swelling in the injured area.  You have numbness, tingling, or a significant loss of strength in the injured area. MAKE SURE YOU:   Understand these instructions.  Will watch your condition.  Will get help right away if you are not doing well or get worse.   This information is not intended to replace advice given to you by your health care provider. Make sure you discuss any questions you have with your health care provider.   Document Released: 10/29/2005 Document Revised: 08/19/2013 Document Reviewed: 05/28/2013 Elsevier Interactive Patient Education 2016 Elsevier Inc.  Urinary Tract Infection A urinary tract infection (UTI) can occur any place along the urinary tract. The tract includes the kidneys, ureters, bladder, and urethra. A type of germ called bacteria often causes a UTI. UTIs are often helped with antibiotic medicine.  HOME CARE   If given, take antibiotics as told by your doctor. Finish them even if you start to feel better.  Drink enough fluids to keep your pee (urine) clear or pale yellow.  Avoid tea, drinks with caffeine, and bubbly (carbonated) drinks.  Pee often. Avoid holding your pee in for a long time.  Pee before and after having sex (intercourse).  Wipe from front to back after you poop (bowel movement) if you are a woman. Use each tissue only once. GET HELP RIGHT AWAY IF:   You have back pain.  You have lower belly (abdominal) pain.  You have chills.  You feel sick to your stomach (nauseous).  You throw up (vomit).  Your burning or discomfort with peeing does not go away.  You have a fever.  Your symptoms are not better in 3 days. MAKE SURE YOU:   Understand these instructions.  Will watch your condition.  Will get  help right away if you are not doing well or get worse.   This information is not intended to replace advice given to you by your health care  provider. Make sure you discuss any questions you have with your health care provider.   Document Released: 04/16/2008 Document Revised: 11/19/2014 Document Reviewed: 05/29/2012 Elsevier Interactive Patient Education [employer].

## 2015-08-30 NOTE — ED Notes (Signed)
Patient still can not void at this time 

## 2015-08-30 NOTE — ED Notes (Signed)
C/o lower abd pain and back pain onset Saturday night Back pain increases w/activity Denies urinary sx, vag d/c A&O x4... No acute distress.

## 2015-08-30 NOTE — ED Provider Notes (Signed)
CSN: [id number]     Arrival date & time 08/30/15  1933 History   First MD Initiated Contact with Patient 08/30/15 2007     Chief Complaint  Patient presents with  . Abdominal Pain   (Consider location/radiation/quality/duration/timing/severity/associated sxs/prior Treatment) HPI Comments: 19 year old obese female complaining of pain in the left and right pelvis for 2 days. She is also developed pain across the low mid back for 2 days. Denies any known injury. She has had no increase in exercise or ambulation. She does states that the pain is worse with ambulation. Denies vaginal discharge. Denies urinary frequency but does complain of dysuria. Her LMP September 8 and she had at least some level of flow for approximately one month.   Past Medical History  Diagnosis Date  . Headache(784.0)   . Prediabetes   . Hypertension   . Diabetes mellitus without complication (HCC)   . Papilledema     Bilateral  . Obesity   . Depression   . Migraine    History reviewed. No pertinent past surgical history. Family History  Problem Relation Age of Onset  . Diabetes Mother   . Diabetes Sister   . Diabetes Maternal Aunt   . Hypertension Maternal Grandmother   . Diabetes Maternal Grandmother    Social History  Substance Use Topics  . Smoking status: Never Smoker   . Smokeless tobacco: Never Used  . Alcohol Use: No   OB History    Gravida Para Term Preterm AB TAB SAB Ectopic Multiple Living   0 0 0 0 0 0 0 0 0 0      Review of Systems  Constitutional: Negative for fever, chills, activity change and fatigue.  HENT: Negative.   Respiratory: Negative.   Cardiovascular: Negative.   Genitourinary: Positive for dysuria, menstrual problem and pelvic pain. Negative for urgency, frequency, flank pain, vaginal bleeding, vaginal discharge, difficulty urinating and vaginal pain.  Musculoskeletal: Positive for myalgias and back pain. Negative for arthralgias and neck pain.       As per HPI  Skin:  Negative for color change, pallor and rash.  Neurological: Negative.     Allergies  Review of patient's allergies indicates no known allergies.  Home Medications   Prior to Admission medications   Medication Sig Start Date End Date Taking? Authorizing Provider  cephALEXin (KEFLEX) 500 MG capsule Take 1 capsule (500 mg total) by mouth 4 (four) times daily. 08/30/15   Hayden Rasmussenavid Chidera Dearcos, NP  metroNIDAZOLE (FLAGYL) 500 MG tablet Take 1 tablet (500 mg total) by mouth 2 (two) times daily. 08/19/15   Brock Badharles A Harper, MD   Meds Ordered and Administered this Visit  Medications - No data to display  BP 127/72 mmHg  Pulse 85  Temp(Src) 98 F (36.7 C) (Oral)  Resp 16  SpO2 98%  LMP 07/20/2015 No data found.   Physical Exam  Constitutional: She is oriented to person, place, and time. She appears well-developed and well-nourished. No distress.  Eyes: EOM are normal.  Neck: Normal range of motion. Neck supple.  Cardiovascular: Normal rate, regular rhythm and normal heart sounds.   Pulmonary/Chest: Effort normal. No respiratory distress.  Abdominal: Soft. Bowel sounds are normal. She exhibits no distension and no mass. There is no tenderness. There is no rebound and no guarding.  Genitourinary:  Palpation over the anterior pelvis reveals tenderness over the lower most aspect of the far right and left pelvis. No midline tenderness. Palpation reveals tenderness to the inguinal canal and the medial proximal  musculature of the thigh. The same exam occurs to the right and left inguina. Straight leg raises do not produce pain however straight leg raises with abduction across the opposite leg does produce pain. Distal neurovascular motor Sentry is intact. Smooth balance gait.  Musculoskeletal: Normal range of motion. She exhibits tenderness. She exhibits no edema.  Neurological: She is alert and oriented to person, place, and time. She exhibits normal muscle tone.  Skin: Skin is warm and dry.  Psychiatric:  She has a normal mood and affect.  Nursing note and vitals reviewed.   ED Course  Procedures (including critical care time)  Labs Review Labs Reviewed  POCT URINALYSIS DIP (DEVICE) - Abnormal; Notable for the following:    Hgb urine dipstick TRACE (*)    Leukocytes, UA SMALL (*)    All other components within normal limits  URINE CULTURE  POCT PREGNANCY, URINE    Imaging Review No results found.   Visual Acuity Review  Right Eye Distance:   Left Eye Distance:   Bilateral Distance:    Right Eye Near:   Left Eye Near:    Bilateral Near:         MDM   1. Groin strain, initial encounter   2. Muscle strain   3. Bilateral low back pain without sciatica   4. UTI (lower urinary tract infection)    Heat to the low back and inguinal areas. Cephalexin for UTI Cataflam 3 times a day when necessary back pain Follow-up as needed.    Hayden Rasmussen, NP 08/30/15 2042

## 2015-09-01 ENCOUNTER — Telehealth: Payer: Self-pay | Admitting: Endocrinology

## 2015-09-01 NOTE — Telephone Encounter (Signed)
Patient no showed today's appt. Please advise on how to follow up. °A. No follow up necessary. °B. Follow up urgent. Contact patient immediately. °C. Follow up necessary. Contact patient and schedule visit in ___ days. °D. Follow up advised. Contact patient and schedule visit in ____weeks. ° °

## 2015-09-01 NOTE — Telephone Encounter (Signed)
Please see below.

## 2015-09-01 NOTE — Telephone Encounter (Signed)
No follow up necessary.  

## 2015-10-14 ENCOUNTER — Ambulatory Visit (INDEPENDENT_AMBULATORY_CARE_PROVIDER_SITE_OTHER): Payer: Managed Care, Other (non HMO) | Admitting: Endocrinology

## 2015-10-14 ENCOUNTER — Encounter: Payer: Self-pay | Admitting: Endocrinology

## 2015-10-14 VITALS — BP 136/88 | HR 77 | Temp 98.1°F | Ht 64.0 in | Wt 258.0 lb

## 2015-10-14 DIAGNOSIS — E282 Polycystic ovarian syndrome: Secondary | ICD-10-CM | POA: Diagnosis not present

## 2015-10-14 MED ORDER — GLUCOSE BLOOD VI STRP: 1.0000 | ORAL_STRIP | Freq: Every day | Status: DC

## 2015-10-14 NOTE — Patient Instructions (Addendum)
Please see an OB/GYN specialist.  you will receive a phone call, about a day and time for an appointment. Aside from any pregnancy, you chances of developing diabetes is approx 10% per year.  Good diet and exercise cuts this to 5% per year. If the pregnancy happens, you should start checking your blood sugar, because you have a high chance of having gestational diabetes.   Also if the pregnancy happens, start checking your blood sugar once a day.  vary the time of day when you check, between before the 3 meals, and at bedtime.  also check if you have symptoms of your blood sugar being too high or too low.  please keep a record of the readings and bring it to your next appointment here (or you can bring the meter itself).  You can write it on any piece of paper.  please call us sooner if your blood sugar goes below 70, or if you have a lot of readings over 200.  Here are 2 identical meters.  i have sent a prescription to your pharmacy, for strips.   Please come back when the pregnancy happens.  If not, please come back for a follow-up appointment in 6 months.

## 2015-10-14 NOTE — Progress Notes (Signed)
Subjective:    Patient ID: Melissa Chang, female    DOB: Aug 13, 1996, 19 y.o.   MRN: [id number]  HPI Pt had menarche at age 19.  She has always had irregular menses. She is G0.  She reports 8 months ago, she developed moderate hair on the face, but no assoc acne.  She has been pursuing a pregnancy for approx 2 months.  She took oral contraceptives for just a few weeks, in early 2016.  she took depo-provera from 2011-2015.  Past Medical History  Diagnosis Date  . Headache(784.0)   . Prediabetes   . Hypertension   . Diabetes mellitus without complication (HCC)   . Papilledema     Bilateral  . Obesity   . Depression   . Migraine     No past surgical history on file.  Social History   Social History  . Marital Status: Single    Spouse Name: N/A  . Number of Children: 0  . Years of Education: 12   Occupational History  . [employer]    Social History Main Topics  . Smoking status: Never Smoker   . Smokeless tobacco: Never Used  . Alcohol Use: No  . Drug Use: No  . Sexual Activity:    Partners: Male    Birth Control/ Protection: Condom, Pill   Other Topics Concern  . Not on file   Social History Narrative   Fun: Sleep, going to the beach   Denies religious beliefs effecting health care.   Denies abuse and feels safe at home.     No current outpatient prescriptions on file prior to visit.   No current facility-administered medications on file prior to visit.    No Known Allergies  Family History  Problem Relation Age of Onset  . Diabetes Mother   . Diabetes Sister   . Diabetes Maternal Aunt   . Hypertension Maternal Grandmother   . Diabetes Maternal Grandmother     BP 136/88 mmHg  Pulse 77  Temp(Src) 98.1 F (36.7 C) (Oral)  Ht 5\' 4"  (1.626 m)  Wt 258 lb (117.028 kg)  BMI 44.26 kg/m2  SpO2 95%     Review of Systems denies hair loss, excessive diaphoresis, sob, hyperpigmentation, easy bruising, muscle weakness, and rash on the abdomen.  She has  fatigue, depression, leg cramps, headache, painful menses, frequent urination, slight numbness of the feet, and weight gain    Objective:   Physical Exam VS: see vs page GEN: no distress HEAD: head: no deformity eyes: no periorbital swelling, no proptosis external nose and ears are normal mouth: no lesion seen NECK: supple, thyroid is not enlarged CHEST WALL: no deformity LUNGS:  Clear to auscultation CV: reg rate and rhythm, no murmur ABD: abdomen is soft, nontender.  no hepatosplenomegaly.  not distended.  no hernia MUSCULOSKELETAL: muscle bulk and strength are grossly normal.  no obvious joint swelling.  gait is normal and steady EXTEMITIES: no deformity.  no ulcer on the feet.  feet are of normal color and temp.  no edema PULSES: dorsalis pedis intact bilat.  no carotid bruit NEURO:  cn 2-12 grossly intact.   readily moves all 4's.  sensation is intact to touch on the feet SKIN:  Normal texture and temperature.  No rash or suspicious lesion is visible.   NODES:  None palpable at the neck PSYCH: alert, well-oriented.  Does not appear anxious nor depressed.    A1c=5.6%  Ovarian US (06/06/12): multiple cysts    Assessment &  Plan:  PCO, new Hyperglycemia: she has high risk of developing GDM  Patient is advised the following: Patient Instructions  Please see an OB/GYN specialist.  you will receive a phone call, about a day and time for an appointment. Aside from any pregnancy, you chances of developing diabetes is approx 10% per year.  Good diet and exercise cuts this to 5% per year. If the pregnancy happens, you should start checking your blood sugar, because you have a high chance of having gestational diabetes.   Also if the pregnancy happens, start checking your blood sugar once a day.  vary the time of day when you check, between before the 3 meals, and at bedtime.  also check if you have symptoms of your blood sugar being too high or too low.  please keep a record of the  readings and bring it to your next appointment here (or you can bring the meter itself).  You can write it on any piece of paper.  please call us sooner if your blood sugar goes below 70, or if you have a lot of readings over 200.  Here are 2 identical meters.  i have sent a prescription to your pharmacy, for strips.   Please come back when the pregnancy happens.  If not, please come back for a follow-up appointment in 6 months.

## 2015-10-16 DIAGNOSIS — E282 Polycystic ovarian syndrome: Secondary | ICD-10-CM | POA: Insufficient documentation

## 2015-11-21 ENCOUNTER — Ambulatory Visit: Payer: Managed Care, Other (non HMO) | Admitting: Obstetrics

## 2015-12-16 ENCOUNTER — Encounter: Payer: Self-pay | Admitting: Endocrinology

## 2015-12-16 ENCOUNTER — Ambulatory Visit: Payer: Managed Care, Other (non HMO) | Admitting: Certified Nurse Midwife

## 2016-04-13 ENCOUNTER — Ambulatory Visit: Payer: Managed Care, Other (non HMO) | Admitting: Endocrinology

## 2016-04-13 DIAGNOSIS — Z0289 Encounter for other administrative examinations: Secondary | ICD-10-CM

## 2018-09-18 ENCOUNTER — Ambulatory Visit (INDEPENDENT_AMBULATORY_CARE_PROVIDER_SITE_OTHER): Payer: BLUE CROSS/BLUE SHIELD | Admitting: Nurse Practitioner

## 2018-09-18 ENCOUNTER — Encounter: Payer: Self-pay | Admitting: Nurse Practitioner

## 2018-09-18 VITALS — BP 112/70 | HR 74 | Temp 98.3°F | Ht 63.0 in | Wt 256.4 lb

## 2018-09-18 DIAGNOSIS — Z13228 Encounter for screening for other metabolic disorders: Secondary | ICD-10-CM

## 2018-09-18 DIAGNOSIS — Z6841 Body Mass Index (BMI) 40.0 and over, adult: Secondary | ICD-10-CM

## 2018-09-18 DIAGNOSIS — Z833 Family history of diabetes mellitus: Secondary | ICD-10-CM | POA: Diagnosis not present

## 2018-09-18 DIAGNOSIS — Z13220 Encounter for screening for lipoid disorders: Secondary | ICD-10-CM

## 2018-09-18 NOTE — Progress Notes (Signed)
Subjective:     Patient ID: Melissa Chang , female    DOB: 06-17-1996 , 22 y.o.   MRN: [id number]   Chief Complaint  Patient presents with  . Establish Care  . fluid retention    pt states for the last couple weeks she has been retaining a lot of fluid    HPI  Establish care - she is new to practice she was referred by her mother who is a patient.  She has recently moved back from [place].  Works at [employer] in [place] 4 days per week.  She has taken metformin in the past which caused upset stomach for prediabetes.   PMH - none. Social wine drinker  [employer] - mother - diabetic (insulin), sisters (3) - diabetic insulin dependent.    Retaining increased fluid.  Eats once per day.  Increased breads and starches.  Denies high salt diet.  Only exercises when at work walking around [employer]. She does admit to gaining approximately 15 lbs.  No birth control.  LMP 08/31/18 regular.  Sexually active.     Past Medical History:  Diagnosis Date  . Depression   . Diabetes mellitus without complication (HCC)   . Headache(784.0)   . Hypertension   . Migraine   . Obesity   . Papilledema    Bilateral  . Prediabetes      Family History  Problem Relation Age of Onset  . Diabetes Mother   . Diabetes Sister   . Diabetes Maternal Aunt   . Hypertension Maternal Grandmother   . Diabetes Maternal Grandmother     No current outpatient medications on file.   No Known Allergies   Review of Systems  Constitutional: Negative.   Eyes: Negative.   Respiratory: Negative.   Cardiovascular: Positive for leg swelling.  Gastrointestinal: Negative.   Musculoskeletal: Negative.   Neurological: Negative.      Today's Vitals   09/18/18 1122  BP: 112/70  Pulse: 74  Temp: 98.3 F (36.8 C)  TempSrc: Oral  SpO2: 98%  Weight: 256 lb 6.4 oz (116.3 kg)  Height: 5\' 3"  (1.6 m)  PainSc: 0-No pain   Body mass index is 45.42 kg/m.   Objective:  Physical Exam  Constitutional: She is oriented to  person, place, and time. She appears well-developed and well-nourished.  Cardiovascular: Normal rate, regular rhythm and normal heart sounds.  Pulmonary/Chest: Effort normal and breath sounds normal.  Musculoskeletal: Normal range of motion. She exhibits edema (trace edema bilateral lower extremities). She exhibits no tenderness.  Neurological: She is alert and oriented to person, place, and time.  Skin: Skin is warm and dry. Capillary refill takes less than 2 seconds.  Psychiatric: She has a normal mood and affect.        Assessment And Plan:     1. Encounter for screening for metabolic disorder   2. Family history of diabetes mellitus type I   3. Family history of diabetes mellitus type II - Hemoglobin A1c - CMP14 + Anion Gap  4. Encounter for screening for lipid disorder   5. Class 3 severe obesity due to excess calories without serious comorbidity with body mass index (BMI) of 40.0 to 44.9 in adult [facility])  Discussed importance of healthy diet and increasing physical activity  Also discussed possible weight loss medications in the future.  I will check for metabolic causes  Reports has had a long time history of weigh challenges  - TSH - CBC no Diff  Minette Brine, FNP

## 2018-09-18 NOTE — Patient Instructions (Signed)
Obesity, Adult Obesity is having too much body fat. If you have a BMI of 30 or more, you are obese. BMI is a number that explains how much body fat you have. Obesity is often caused by taking in (consuming) more calories than your body uses. Obesity can cause serious health problems. Changing your lifestyle can help to treat obesity. Follow these instructions at home: Eating and drinking   Follow advice from your doctor about what to eat and drink. Your doctor may tell you to: ? Cut down on (limit) fast foods, sweets, and processed snack foods. ? Choose low-fat options. For example, choose low-fat milk instead of whole milk. ? Eat 5 or more servings of fruits or vegetables every day. ? Eat at home more often. This gives you more control over what you eat. ? Choose healthy foods when you eat out. ? Learn what a healthy portion size is. A portion size is the amount of a certain food that is healthy for you to eat at one time. This is different for each person. ? Keep low-fat snacks available. ? Avoid sugary drinks. These include soda, fruit juice, iced tea that is sweetened with sugar, and flavored milk. ? Eat a healthy breakfast.  Drink enough water to keep your pee (urine) clear or pale yellow.  Do not go without eating for long periods of time (do not fast).  Do not go on popular or trendy diets (fad diets). Physical Activity  Exercise often, as told by your doctor. Ask your doctor: ? What types of exercise are safe for you. ? How often you should exercise.  Warm up and stretch before being active.  Do slow stretching after being active (cool down).  Rest between times of being active. Lifestyle  Limit how much time you spend in front of your TV, computer, or video game system (be less sedentary).  Find ways to reward yourself that do not involve food.  Limit alcohol intake to no more than 1 drink a day for nonpregnant women and 2 drinks a day for men. One drink equals 12 oz  of beer, 5 oz of wine, or 1 oz of hard liquor. General instructions  Keep a weight loss journal. This can help you keep track of: ? The food that you eat. ? The exercise that you do.  Take over-the-counter and prescription medicines only as told by your doctor.  Take vitamins and supplements only as told by your doctor.  Think about joining a support group. Your doctor may be able to help with this.  Keep all follow-up visits as told by your doctor. This is important. Contact a doctor if:  You cannot meet your weight loss goal after you have changed your diet and lifestyle for 6 weeks. This information is not intended to replace advice given to you by your health care provider. Make sure you discuss any questions you have with your health care provider. Document Released: 01/21/2012 Document Revised: 04/05/2016 Document Reviewed: 08/17/2015 Elsevier Interactive Patient Education  2018 Elsevier Inc.  

## 2018-09-19 LAB — TSH: TSH: 1.12 u[IU]/mL (ref 0.450–4.500)

## 2018-09-19 LAB — CMP14 + ANION GAP
ALT: 14 IU/L (ref 0–32)
ANION GAP: 14 mmol/L (ref 10.0–18.0)
AST: 16 IU/L (ref 0–40)
Albumin/Globulin Ratio: 1.7 (ref 1.2–2.2)
Albumin: 4.2 g/dL (ref 3.5–5.5)
Alkaline Phosphatase: 121 IU/L — ABNORMAL HIGH (ref 39–117)
BUN/Creatinine Ratio: 12 (ref 9–23)
BUN: 11 mg/dL (ref 6–20)
Bilirubin Total: 0.5 mg/dL (ref 0.0–1.2)
CALCIUM: 9.2 mg/dL (ref 8.7–10.2)
CO2: 21 mmol/L (ref 20–29)
CREATININE: 0.9 mg/dL (ref 0.57–1.00)
Chloride: 101 mmol/L (ref 96–106)
GFR calc Af Amer: 103 mL/min/{1.73_m2} (ref >59)
GFR, EST NON AFRICAN AMERICAN: 90 mL/min/{1.73_m2} (ref >59)
GLUCOSE: 74 mg/dL (ref 65–99)
Globulin, Total: 2.5 g/dL (ref 1.5–4.5)
POTASSIUM: 4.5 mmol/L (ref 3.5–5.2)
Sodium: 136 mmol/L (ref 134–144)
Total Protein: 6.7 g/dL (ref 6.0–8.5)

## 2018-09-19 LAB — CBC
HEMATOCRIT: 41.5 % (ref 34.0–46.6)
HEMOGLOBIN: 13.8 g/dL (ref 11.1–15.9)
MCH: 29.7 pg (ref 26.6–33.0)
MCHC: 33.3 g/dL (ref 31.5–35.7)
MCV: 89 fL (ref 79–97)
PLATELETS: 285 10*3/uL (ref 150–450)
RBC: 4.65 x10E6/uL (ref 3.77–5.28)
RDW: 12.2 % — ABNORMAL LOW (ref 12.3–15.4)
WBC: 11.8 10*3/uL — AB (ref 3.4–10.8)

## 2018-09-19 LAB — HEMOGLOBIN A1C
Est. average glucose Bld gHb Est-mCnc: 117 mg/dL
Hgb A1c MFr Bld: 5.7 % — ABNORMAL HIGH (ref 4.8–5.6)

## 2018-09-20 ENCOUNTER — Encounter: Payer: Self-pay | Admitting: Nurse Practitioner

## 2018-09-30 ENCOUNTER — Encounter: Payer: Self-pay | Admitting: Nurse Practitioner

## 2018-09-30 DIAGNOSIS — Z6841 Body Mass Index (BMI) 40.0 and over, adult: Secondary | ICD-10-CM

## 2018-10-29 ENCOUNTER — Encounter: Payer: Self-pay | Admitting: Nurse Practitioner

## 2018-10-30 ENCOUNTER — Encounter: Payer: Self-pay | Admitting: Internal Medicine

## 2018-10-30 ENCOUNTER — Ambulatory Visit (INDEPENDENT_AMBULATORY_CARE_PROVIDER_SITE_OTHER): Payer: BLUE CROSS/BLUE SHIELD | Admitting: Internal Medicine

## 2018-10-30 VITALS — BP 116/70 | HR 81 | Temp 98.3°F | Ht 63.0 in | Wt 255.6 lb

## 2018-10-30 DIAGNOSIS — J0100 Acute maxillary sinusitis, unspecified: Secondary | ICD-10-CM | POA: Diagnosis not present

## 2018-10-30 MED ORDER — AMOXICILLIN-POT CLAVULANATE 875-125 MG PO TABS: 1.0000 | ORAL_TABLET | Freq: Two times a day (BID) | ORAL | 0 refills | Status: AC

## 2018-10-30 MED ORDER — FLUTICASONE PROPIONATE 50 MCG/ACT NA SUSP: 2.0000 | Freq: Every day | NASAL | 0 refills | Status: DC

## 2018-10-30 NOTE — Patient Instructions (Signed)

## 2018-10-30 NOTE — Progress Notes (Signed)
Subjective:     Patient ID: Melissa Chang , female    DOB: 1996/05/29 , 22 y.o.   MRN: [id number]   Chief Complaint  Patient presents with  . Nasal Congestion    C/O coughing, headache, nasal congestion for 2 weeks     HPI Onset of URI x 2 weeks which started with cough, then developed rhinitis. Has a ST now and hurts with swallowing and coughing.  Cough is with occasional yellow mucous. Has not had a fever, but has had night sweats and chills. Has taken OTC meds, but not getting better.     Past Medical History:  Diagnosis Date  . Depression   . Diabetes mellitus without complication ([place])   . Headache(784.0)   . Hypertension   . Migraine   . Obesity   . Papilledema    Bilateral  . Prediabetes      Family History  Problem Relation Age of Onset  . Diabetes Mother   . Diabetes Sister   . Diabetes Maternal Aunt   . Hypertension Maternal Grandmother   . Diabetes Maternal Grandmother     No current outpatient medications on file.   No Known Allergies   Review of Systems  Constitutional: Positive for appetite change, chills and diaphoresis. Negative for fever.  HENT: Positive for postnasal drip, rhinorrhea, sinus pressure, sinus pain and sore throat. Negative for ear discharge, ear pain and hearing loss.   Respiratory: Positive for cough. Negative for shortness of breath and wheezing.   Cardiovascular: Negative for chest pain.  Gastrointestinal: Negative for diarrhea, nausea and vomiting.  Skin: Negative for rash.  Neurological: Positive for headaches. Negative for dizziness.    Today's Vitals   10/30/18 0915  BP: 116/70  Pulse: 81  Temp: 98.3 F (36.8 C)  TempSrc: Oral  SpO2: 98%  Weight: 255 lb 9.6 oz (115.9 kg)  Height: _0  (1.6 m)   Body mass index is 45.28 kg/m.   Objective:  Physical Exam Vitals signs and nursing note reviewed.  Constitutional:      General: She is not in acute distress.    Appearance: Normal appearance. She is not  toxic-appearing.  HENT:     Head: Normocephalic.     Right Ear: Tympanic membrane, ear canal and external ear normal.     Left Ear: Tympanic membrane, ear canal and external ear normal.     Nose: Congestion present.     Comments: Has tenderness on maxillary sinuses R>L    Mouth/Throat:     Mouth: Mucous membranes are moist.     Pharynx: Oropharynx is clear.  Eyes:     General: No scleral icterus.    Conjunctiva/sclera: Conjunctivae normal.  Neck:     Musculoskeletal: Neck supple. No neck rigidity.  Cardiovascular:     Rate and Rhythm: Normal rate and regular rhythm.     Heart sounds: No murmur.  Pulmonary:     Effort: Pulmonary effort is normal. No respiratory distress.     Breath sounds: No wheezing, rhonchi or rales.  Musculoskeletal: Normal range of motion.  Lymphadenopathy:     Cervical: No cervical adenopathy.  Skin:    General: Skin is warm and dry.     Findings: No rash.  Neurological:     Mental Status: She is alert and oriented to person, place, and time.     Gait: Gait normal.  Psychiatric:        Mood and Affect: Mood normal.  Behavior: Behavior normal.        Thought Content: Thought content normal.        Judgment: Judgment normal.     Assessment And Plan:   1. Subacute maxillary sinusitis- acute. I advised her to do saline nose rinses with a [employer]. I placed her on Augmentin and Flonase as noted.  FU prn.      Joletta Manner RODRIGUEZ-SOUTHWORTH, PA-C

## 2018-12-18 ENCOUNTER — Encounter: Payer: BLUE CROSS/BLUE SHIELD | Admitting: Nurse Practitioner

## 2018-12-24 ENCOUNTER — Encounter: Payer: BLUE CROSS/BLUE SHIELD | Admitting: Nurse Practitioner

## 2018-12-25 ENCOUNTER — Encounter: Payer: BLUE CROSS/BLUE SHIELD | Admitting: Nurse Practitioner

## 2018-12-26 ENCOUNTER — Encounter: Payer: BLUE CROSS/BLUE SHIELD | Admitting: Nurse Practitioner

## 2019-01-02 ENCOUNTER — Ambulatory Visit: Payer: BLUE CROSS/BLUE SHIELD | Admitting: Nurse Practitioner

## 2019-01-08 ENCOUNTER — Ambulatory Visit (INDEPENDENT_AMBULATORY_CARE_PROVIDER_SITE_OTHER): Payer: BLUE CROSS/BLUE SHIELD | Admitting: Certified Nurse Midwife

## 2019-01-08 ENCOUNTER — Encounter: Payer: Self-pay | Admitting: Certified Nurse Midwife

## 2019-01-08 ENCOUNTER — Other Ambulatory Visit: Payer: Self-pay

## 2019-01-08 ENCOUNTER — Ambulatory Visit (INDEPENDENT_AMBULATORY_CARE_PROVIDER_SITE_OTHER): Payer: BLUE CROSS/BLUE SHIELD | Admitting: Nurse Practitioner

## 2019-01-08 ENCOUNTER — Encounter: Payer: Self-pay | Admitting: Nurse Practitioner

## 2019-01-08 VITALS — BP 138/72 | HR 70 | Temp 98.1°F | Ht 64.2 in | Wt 250.4 lb

## 2019-01-08 VITALS — BP 125/81 | HR 71 | Wt 253.2 lb

## 2019-01-08 DIAGNOSIS — R1012 Left upper quadrant pain: Secondary | ICD-10-CM

## 2019-01-08 DIAGNOSIS — R7303 Prediabetes: Secondary | ICD-10-CM | POA: Diagnosis not present

## 2019-01-08 DIAGNOSIS — A5901 Trichomonal vulvovaginitis: Secondary | ICD-10-CM

## 2019-01-08 DIAGNOSIS — H6123 Impacted cerumen, bilateral: Secondary | ICD-10-CM

## 2019-01-08 DIAGNOSIS — Z01419 Encounter for gynecological examination (general) (routine) without abnormal findings: Secondary | ICD-10-CM

## 2019-01-08 DIAGNOSIS — Z124 Encounter for screening for malignant neoplasm of cervix: Secondary | ICD-10-CM

## 2019-01-08 DIAGNOSIS — Z23 Encounter for immunization: Secondary | ICD-10-CM

## 2019-01-08 DIAGNOSIS — N898 Other specified noninflammatory disorders of vagina: Secondary | ICD-10-CM

## 2019-01-08 DIAGNOSIS — Z113 Encounter for screening for infections with a predominantly sexual mode of transmission: Secondary | ICD-10-CM

## 2019-01-08 DIAGNOSIS — N3000 Acute cystitis without hematuria: Secondary | ICD-10-CM

## 2019-01-08 DIAGNOSIS — F4322 Adjustment disorder with anxiety: Secondary | ICD-10-CM

## 2019-01-08 DIAGNOSIS — R42 Dizziness and giddiness: Secondary | ICD-10-CM

## 2019-01-08 DIAGNOSIS — R300 Dysuria: Secondary | ICD-10-CM

## 2019-01-08 DIAGNOSIS — Z0000 Encounter for general adult medical examination without abnormal findings: Secondary | ICD-10-CM | POA: Diagnosis not present

## 2019-01-08 LAB — POCT URINALYSIS DIPSTICK
Bilirubin, UA: NEGATIVE
Glucose, UA: NEGATIVE
Ketones, UA: NEGATIVE
Nitrite, UA: NEGATIVE
Protein, UA: POSITIVE — AB
Spec Grav, UA: 1.01 (ref 1.010–1.025)
Urobilinogen, UA: 0.2 E.U./dL
pH, UA: 7 (ref 5.0–8.0)

## 2019-01-08 MED ORDER — TETANUS-DIPHTH-ACELL PERTUSSIS 5-2.5-18.5 LF-MCG/0.5 IM SUSP
0.5000 mL | Freq: Once | INTRAMUSCULAR | Status: AC
  Administered 2019-01-08: 0.5 mL via INTRAMUSCULAR

## 2019-01-08 NOTE — Progress Notes (Signed)
Pt is here for annual exam. Last pap 6 months ago, normal reported by patient. Pt is currently not using anything for contraception, is not interested at this time. Pt reports she is having abnormal vaginal discharge with an odor for about 2 weeks. Pt also reports dysuria for about 2 weeks as well.

## 2019-01-08 NOTE — Progress Notes (Addendum)
Subjective:     Patient ID: Melissa Chang , female    DOB: 14-Apr-1996 , 23 y.o.   MRN: [id number]   Chief Complaint  Patient presents with  . Annual Exam   The patient states she uses none for birth control. Last LMP was Patient's last menstrual period was 01/01/2019 (exact date).. Negative for Dysmenorrhea and Negative for Menorrhagia  Negative for: breast discharge, breast lump(s), breast pain and breast self exam.  Pertinent negatives include abnormal bleeding (hematology), anxiety, decreased libido, depression, difficulty falling sleep, dyspareunia, history of infertility, nocturia, sexual dysfunction, sleep disturbances, urinary incontinence, urinary urgency, vaginal discharge and vaginal itching. Diet regular. She is eating one time per day.  The patient states her exercise level is  none.      The patient's tobacco use is:  Social History   Tobacco Use  Smoking Status Never Smoker  Smokeless Tobacco Never Used   She has been exposed to passive smoke. The patient's alcohol use is:  Social History   Substance and Sexual Activity  Alcohol Use Yes  . Alcohol/week: 0.0 standard drinks   Comment: socially   Additional information: Last pap 01/08/2019 by GYN, next one scheduled for unknown at this time pending PAP.   HPI  Here for HM  Left upper abdomen/ribs to touch, lifts heavy objects.      Past Medical History:  Diagnosis Date  . Depression   . Diabetes mellitus without complication ([place])   . Headache(784.0)   . Migraine   . Obesity   . Papilledema    Bilateral  . Prediabetes      Family History  Problem Relation Age of Onset  . Diabetes Mother   . Diabetes Sister   . Diabetes Maternal Aunt   . Hypertension Maternal Grandmother   . Diabetes Maternal Grandmother      Current Outpatient Medications:  .  fluticasone (FLONASE) 50 MCG/ACT nasal spray, Place 2 sprays into both nostrils daily., Disp: 17 g, Rfl: 0   No Known Allergies   Review of Systems   Constitutional: Negative.   HENT: Negative.   Eyes: Negative.   Respiratory: Negative.   Cardiovascular: Negative.   Gastrointestinal: Negative.   Endocrine: Negative.   Genitourinary: Negative.   Musculoskeletal: Negative.   Skin: Negative.        Hair loss.    Allergic/Immunologic: Negative.   Neurological: Positive for dizziness. Negative for headaches.  Hematological: Negative.   Psychiatric/Behavioral: Negative.      Today's Vitals   01/08/19 1551  BP: 138/72  Pulse: 70  Temp: 98.1 F (36.7 C)  TempSrc: Oral  SpO2: 98%  Weight: 250 lb 6.4 oz (113.6 kg)  Height: 5' 4.2" (1.631 m)   Body mass index is 42.71 kg/m.   Objective:  Physical Exam Vitals signs reviewed.  Constitutional:      Appearance: Normal appearance. She is well-developed.  HENT:     Head: Normocephalic and atraumatic.     Right Ear: Hearing and external ear normal. There is impacted cerumen.     Left Ear: Hearing and external ear normal. There is impacted cerumen.     Nose: Nose normal.     Mouth/Throat:     Mouth: Mucous membranes are moist.  Eyes:     General: Lids are normal.     Extraocular Movements: Extraocular movements intact.     Conjunctiva/sclera: Conjunctivae normal.     Pupils: Pupils are equal, round, and reactive to light.     Funduscopic exam:  Right eye: No papilledema.        Left eye: No papilledema.  Neck:     Musculoskeletal: Full passive range of motion without pain, normal range of motion and neck supple.     Thyroid: No thyroid mass.     Vascular: No carotid bruit.  Cardiovascular:     Rate and Rhythm: Normal rate and regular rhythm.     Pulses: Normal pulses.     Heart sounds: Normal heart sounds. No murmur.  Pulmonary:     Effort: Pulmonary effort is normal.     Breath sounds: Normal breath sounds.  Abdominal:     General: Abdomen is flat. Bowel sounds are normal.     Palpations: Abdomen is soft.  Musculoskeletal: Normal range of motion.        General:  No swelling.     Right lower leg: No edema.     Left lower leg: No edema.  Skin:    General: Skin is warm and dry.     Capillary Refill: Capillary refill takes less than 2 seconds.  Neurological:     General: No focal deficit present.     Mental Status: She is alert and oriented to person, place, and time.     Cranial Nerves: No cranial nerve deficit.     Sensory: No sensory deficit.  Psychiatric:        Mood and Affect: Mood normal.        Behavior: Behavior normal.        Thought Content: Thought content normal.        Judgment: Judgment normal.         Assessment And Plan:     1. Health maintenance examination . Behavior modifications discussed and diet history reviewed.   . Pt will continue to exercise regularly and modify diet with low GI, plant based foods and decrease intake of processed foods.  . Recommend intake of daily multivitamin, Vitamin D, and calcium.  . Recommend for preventive screenings, as well as recommend immunizations that include influenza, TDAP - CBC no Diff  2. Prediabetes - Hemoglobin A1c - BMP8+eGFR  3. Bilateral impacted cerumen  Water lavage done bilaterally  Good results  4. Encounter for immunization  Will give tetanus vaccine today while in office. Refer to order management. TDAP will be administered to adults 78-30 years old every 10 years. - Tdap (BOOSTRIX) injection 0.5 mL  5. Adjustment disorder with anxiety  Offered to send to counseling at this time not interested however she is aware she can contact her employee health  6. Dizziness  She had cerumen impaction which can cause dizziness  Advised if persist to return call to office.    7. Left upper quadrant abdominal pain of unknown etiology  Possible muscle strain related to her lifting         Minette Brine, FNP

## 2019-01-08 NOTE — Patient Instructions (Addendum)
Health Maintenance, Female Adopting a healthy lifestyle and getting preventive care can go a long way to promote health and wellness. Talk with your health care provider about what schedule of regular examinations is right for you. This is a good chance for you to check in with your provider about disease prevention and staying healthy. In between checkups, there are plenty of things you can do on your own. Experts have done a lot of research about which lifestyle changes and preventive measures are most likely to keep you healthy. Ask your health care provider for more information. Weight and diet Eat a healthy diet  Be sure to include plenty of vegetables, fruits, low-fat dairy products, and lean protein.  Do not eat a lot of foods high in solid fats, added sugars, or salt.  Get regular exercise. This is one of the most important things you can do for your health. ? Most adults should exercise for at least 150 minutes each week. The exercise should increase your heart rate and make you sweat (moderate-intensity exercise). ? Most adults should also do strengthening exercises at least twice a week. This is in addition to the moderate-intensity exercise. Maintain a healthy weight  Body mass index (BMI) is a measurement that can be used to identify possible weight problems. It estimates body fat based on height and weight. Your health care provider can help determine your BMI and help you achieve or maintain a healthy weight.  For females 20 years of age and older: ? A BMI below 18.5 is considered underweight. ? A BMI of 18.5 to 24.9 is normal. ? A BMI of 25 to 29.9 is considered overweight. ? A BMI of 30 and above is considered obese. Watch levels of cholesterol and blood lipids  You should start having your blood tested for lipids and cholesterol at 25 years of age, then have this test every 5 years.  You may need to have your cholesterol levels checked more often if: ? Your lipid or  cholesterol levels are high. ? You are older than 25 years of age. ? You are at high risk for heart disease. Cancer screening Lung Cancer  Lung cancer screening is recommended for adults 55-80 years old who are at high risk for lung cancer because of a history of smoking.  A yearly low-dose CT scan of the lungs is recommended for people who: ? Currently smoke. ? Have quit within the past 15 years. ? Have at least a 30-pack-year history of smoking. A pack year is smoking an average of one pack of cigarettes a day for 1 year.  Yearly screening should continue until it has been 15 years since you quit.  Yearly screening should stop if you develop a health problem that would prevent you from having lung cancer treatment. Breast Cancer  Practice breast self-awareness. This means understanding how your breasts normally appear and feel.  It also means doing regular breast self-exams. Let your health care provider know about any changes, no matter how small.  If you are in your 20s or 30s, you should have a clinical breast exam (CBE) by a health care provider every 1-3 years as part of a regular health exam.  If you are 40 or older, have a CBE every year. Also consider having a breast X-ray (mammogram) every year.  If you have a family history of breast cancer, talk to your health care provider about genetic screening.  If you are at high risk for breast cancer, talk   to your health care provider about having an MRI and a mammogram every year.  Breast cancer gene (BRCA) assessment is recommended for women who have family members with BRCA-related cancers. BRCA-related cancers include: ? Breast. ? Ovarian. ? Tubal. ? Peritoneal cancers.  Results of the assessment will determine the need for genetic counseling and BRCA1 and BRCA2 testing. Cervical Cancer Your health care provider may recommend that you be screened regularly for cancer of the pelvic organs (ovaries, uterus, and vagina).  This screening involves a pelvic examination, including checking for microscopic changes to the surface of your cervix (Pap test). You may be encouraged to have this screening done every 3 years, beginning at age 21.  For women ages 30-65, health care providers may recommend pelvic exams and Pap testing every 3 years, or they may recommend the Pap and pelvic exam, combined with testing for human papilloma virus (HPV), every 5 years. Some types of HPV increase your risk of cervical cancer. Testing for HPV may also be done on women of any age with unclear Pap test results.  Other health care providers may not recommend any screening for nonpregnant women who are considered low risk for pelvic cancer and who do not have symptoms. Ask your health care provider if a screening pelvic exam is right for you.  If you have had past treatment for cervical cancer or a condition that could lead to cancer, you need Pap tests and screening for cancer for at least 20 years after your treatment. If Pap tests have been discontinued, your risk factors (such as having a new sexual partner) need to be reassessed to determine if screening should resume. Some women have medical problems that increase the chance of getting cervical cancer. In these cases, your health care provider may recommend more frequent screening and Pap tests. Colorectal Cancer  This type of cancer can be detected and often prevented.  Routine colorectal cancer screening usually begins at 25 years of age and continues through 25 years of age.  Your health care provider may recommend screening at an earlier age if you have risk factors for colon cancer.  Your health care provider may also recommend using home test kits to check for hidden blood in the stool.  A small camera at the end of a tube can be used to examine your colon directly (sigmoidoscopy or colonoscopy). This is done to check for the earliest forms of colorectal cancer.  Routine  screening usually begins at age 50.  Direct examination of the colon should be repeated every 5-10 years through 25 years of age. However, you may need to be screened more often if early forms of precancerous polyps or small growths are found. Skin Cancer  Check your skin from head to toe regularly.  Tell your health care provider about any new moles or changes in moles, especially if there is a change in a mole's shape or color.  Also tell your health care provider if you have a mole that is larger than the size of a pencil eraser.  Always use sunscreen. Apply sunscreen liberally and repeatedly throughout the day.  Protect yourself by wearing long sleeves, pants, a wide-brimmed hat, and sunglasses whenever you are outside. Heart disease, diabetes, and high blood pressure  High blood pressure causes heart disease and increases the risk of stroke. High blood pressure is more likely to develop in: ? People who have blood pressure in the high end of the normal range (130-139/85-89 mm Hg). ? People   who are overweight or obese. ? People who are African American.  If you are 84-22 years of age, have your blood pressure checked every 3-5 years. If you are 67 years of age or older, have your blood pressure checked every year. You should have your blood pressure measured twice-once when you are at a hospital or clinic, and once when you are not at a hospital or clinic. Record the average of the two measurements. To check your blood pressure when you are not at a hospital or clinic, you can use: ? An automated blood pressure machine at a pharmacy. ? A home blood pressure monitor.  If you are between 52 years and 3 years old, ask your health care provider if you should take aspirin to prevent strokes.  Have regular diabetes screenings. This involves taking a blood sample to check your fasting blood sugar level. ? If you are at a normal weight and have a low risk for diabetes, have this test once  every three years after 25 years of age. ? If you are overweight and have a high risk for diabetes, consider being tested at a younger age or more often. Preventing infection Hepatitis B  If you have a higher risk for hepatitis B, you should be screened for this virus. You are considered at high risk for hepatitis B if: ? You were born in a country where hepatitis B is common. Ask your health care provider which countries are considered high risk. ? Your parents were born in a high-risk country, and you have not been immunized against hepatitis B (hepatitis B vaccine). ? You have HIV or AIDS. ? You use needles to inject street drugs. ? You live with someone who has hepatitis B. ? You have had sex with someone who has hepatitis B. ? You get hemodialysis treatment. ? You take certain medicines for conditions, including cancer, organ transplantation, and autoimmune conditions. Hepatitis C  Blood testing is recommended for: ? Everyone born from [birth year] through 1967. ? Anyone with known risk factors for hepatitis C. Sexually transmitted infections (STIs)  You should be screened for sexually transmitted infections (STIs) including gonorrhea and chlamydia if: ? You are sexually active and are younger than 25 years of age. ? You are older than 25 years of age and your health care provider tells you that you are at risk for this type of infection. ? Your sexual activity has changed since you were last screened and you are at an increased risk for chlamydia or gonorrhea. Ask your health care provider if you are at risk.  If you do not have HIV, but are at risk, it may be recommended that you take a prescription medicine daily to prevent HIV infection. This is called pre-exposure prophylaxis (PrEP). You are considered at risk if: ? You are sexually active and do not regularly use condoms or know the HIV status of your partner(s). ? You take drugs by injection. ? You are sexually active with a partner  who has HIV. Talk with your health care provider about whether you are at high risk of being infected with HIV. If you choose to begin PrEP, you should first be tested for HIV. You should then be tested every 3 months for as long as you are taking PrEP. Pregnancy  If you are premenopausal and you may become pregnant, ask your health care provider about preconception counseling.  If you may become pregnant, take 400 to 800 micrograms (mcg) of folic acid every  day.  If you want to prevent pregnancy, talk to your health care provider about birth control (contraception). Osteoporosis and menopause  Osteoporosis is a disease in which the bones lose minerals and strength with aging. This can result in serious bone fractures. Your risk for osteoporosis can be identified using a bone density scan.  If you are 48 years of age or older, or if you are at risk for osteoporosis and fractures, ask your health care provider if you should be screened.  Ask your health care provider whether you should take a calcium or vitamin D supplement to lower your risk for osteoporosis.  Menopause may have certain physical symptoms and risks.  Hormone replacement therapy may reduce some of these symptoms and risks. Talk to your health care provider about whether hormone replacement therapy is right for you. Follow these instructions at home:  Schedule regular health, dental, and eye exams.  Stay current with your immunizations.  Do not use any tobacco products including cigarettes, chewing tobacco, or electronic cigarettes.  If you are pregnant, do not drink alcohol.  If you are breastfeeding, limit how much and how often you drink alcohol.  Limit alcohol intake to no more than 1 drink per day for nonpregnant women. One drink equals 12 ounces of beer, 5 ounces of wine, or 1 ounces of hard liquor.  Do not use street drugs.  Do not share needles.  Ask your health care provider for help if you need support  or information about quitting drugs.  Tell your health care provider if you often feel depressed.  Tell your health care provider if you have ever been abused or do not feel safe at home. This information is not intended to replace advice given to you by your health care provider. Make sure you discuss any questions you have with your health care provider. Document Released: 05/14/2011 Document Revised: 04/05/2016 Document Reviewed: 08/02/2015 Elsevier Interactive Patient Education  2019 [employer].   Tdap Vaccine (Tetanus, Diphtheria and Pertussis): What You Need to Know 1. Why get vaccinated? Tetanus, diphtheria and pertussis are very serious diseases. Tdap vaccine can protect [place] from these diseases. And, Tdap vaccine given to pregnant women can protect newborn babies against pertussis.Marland Kitchen TETANUS (Lockjaw) is rare in the [place] today. It causes painful muscle tightening and stiffness, usually all over the body.  It can lead to tightening of muscles in the head and neck so you can't open your mouth, swallow, or sometimes even breathe. Tetanus kills about 1 out of 10 people who are infected even after receiving the best medical care. DIPHTHERIA is also rare in the [place] today. It can cause a thick coating to form in the back of the throat.  It can lead to breathing problems, heart failure, paralysis, and death. PERTUSSIS (Whooping Cough) causes severe coughing spells, which can cause difficulty breathing, vomiting and disturbed sleep.  It can also lead to weight loss, incontinence, and rib fractures. Up to 2 in 100 adolescents and 5 in 100 adults with pertussis are hospitalized or have complications, which could include pneumonia or death. These diseases are caused by bacteria. Diphtheria and pertussis are spread from person to person through secretions from coughing or sneezing. Tetanus enters the body through cuts, scratches, or wounds. Before vaccines, as many as 200,000  cases of diphtheria, 200,000 cases of pertussis, and hundreds of cases of tetanus, were reported in the [place] each year. Since vaccination began, reports of cases for tetanus and diphtheria  have dropped by about 99% and for pertussis by about 80%. 2. Tdap vaccine Tdap vaccine can protect adolescents and adults from tetanus, diphtheria, and pertussis. One dose of Tdap is routinely given at age 88 or 22. People who did not get Tdap at that age should get it as soon as possible. Tdap is especially important for healthcare professionals and anyone having close contact with a baby younger than 12 months. Pregnant women should get a dose of Tdap during every pregnancy, to protect the newborn from pertussis. Infants are most at risk for severe, life-threatening complications from pertussis. Another vaccine, called Td, protects against tetanus and diphtheria, but not pertussis. A Td booster should be given every 10 years. Tdap may be given as one of these boosters if you have never gotten Tdap before. Tdap may also be given after a severe cut or burn to prevent tetanus infection. Your doctor or the person giving you the vaccine can give you more information. Tdap may safely be given at the same time as other vaccines. 3. Some people should not get this vaccine  A person who has ever had a life-threatening allergic reaction after a previous dose of any diphtheria, tetanus or pertussis containing vaccine, OR has a severe allergy to any part of this vaccine, should not get Tdap vaccine. Tell the person giving the vaccine about any severe allergies.  Anyone who had coma or long repeated seizures within 7 days after a childhood dose of DTP or DTaP, or a previous dose of Tdap, should not get Tdap, unless a cause other than the vaccine was found. They can still get Td.  Talk to your doctor if you: ? have seizures or another nervous system problem, ? had severe pain or swelling after any vaccine containing  diphtheria, tetanus or pertussis, ? ever had a condition called Guillain-Barr Syndrome (GBS), ? aren't feeling well on the day the shot is scheduled. 4. Risks With any medicine, including vaccines, there is a chance of side effects. These are usually mild and go away on their own. Serious reactions are also possible but are rare. Most people who get Tdap vaccine do not have any problems with it. Mild problems following Tdap (Did not interfere with activities)  Pain where the shot was given (about 3 in 4 adolescents or 2 in 3 adults)  Redness or swelling where the shot was given (about 1 person in 5)  Mild fever of at least 100.62F (up to about 1 in 25 adolescents or 1 in 100 adults)  Headache (about 3 or 4 people in 10)  Tiredness (about 1 person in 3 or 4)  Nausea, vomiting, diarrhea, stomach ache (up to 1 in 4 adolescents or 1 in 10 adults)  Chills, sore joints (about 1 person in 10)  Body aches (about 1 person in 3 or 4)  Rash, swollen glands (uncommon) Moderate problems following Tdap (Interfered with activities, but did not require medical attention)  Pain where the shot was given (up to 1 in 5 or 6)  Redness or swelling where the shot was given (up to about 1 in 16 adolescents or 1 in 12 adults)  Fever over 102F (about 1 in 100 adolescents or 1 in 250 adults)  Headache (about 1 in 7 adolescents or 1 in 10 adults)  Nausea, vomiting, diarrhea, stomach ache (up to 1 or 3 people in 100)  Swelling of the entire arm where the shot was given (up to about 1 in 500). Severe problems  following Tdap (Unable to perform usual activities; required medical attention)  Swelling, severe pain, bleeding and redness in the arm where the shot was given (rare). Problems that could happen after any vaccine:  People sometimes faint after a medical procedure, including vaccination. Sitting or lying down for about 15 minutes can help prevent fainting, and injuries caused by a fall. Tell  your doctor if you feel dizzy, or have vision changes or ringing in the ears.  Some people get severe pain in the shoulder and have difficulty moving the arm where a shot was given. This happens very rarely.  Any medication can cause a severe allergic reaction. Such reactions from a vaccine are very rare, estimated at fewer than 1 in a million doses, and would happen within a few minutes to a few hours after the vaccination. As with any medicine, there is a very remote chance of a vaccine causing a serious injury or death. The safety of vaccines is always being monitored. For more information, visit: http://www.aguilar.org/ 5. What if there is a serious problem? What should I look for?  Look for anything that concerns you, such as signs of a severe allergic reaction, very high fever, or unusual behavior. Signs of a severe allergic reaction can include hives, swelling of the face and throat, difficulty breathing, a fast heartbeat, dizziness, and weakness. These would usually start a few minutes to a few hours after the vaccination. What should I do?  If you think it is a severe allergic reaction or other emergency that can't wait, call 9-1-1 or get the person to the nearest hospital. Otherwise, call your doctor.  Afterward, the reaction should be reported to the Vaccine Adverse Event Reporting System (VAERS). Your doctor might file this report, or you can do it yourself through the VAERS web site at www.vaers.SamedayNews.es, or by calling (903)375-5478. VAERS does not give medical advice. 6. The National Vaccine Injury Compensation Program The [employer] Vaccine Injury Compensation Program (VICP) is a federal program that was created to compensate people who may have been injured by certain vaccines. Persons who believe they may have been injured by a vaccine can learn about the program and about filing a claim by calling (517) 192-9260 or visiting the [employer] website at GoldCloset.com.ee.  There is a time limit to file a claim for compensation. 7. How can I learn more?  Ask your doctor. He or she can give you the vaccine package insert or suggest other sources of information.  Call your local or state health department.  Contact the Centers for Disease Control and Prevention (CDC): ? Call 585-201-6259 (1-800-CDC-INFO) or ? Visit CDC's website at http://hunter.com/ Vaccine Information Statement Tdap Vaccine (01/05/2014) This information is not intended to replace advice given to you by your health care provider. Make sure you discuss any questions you have with your health care provider. Document Released: 04/29/2012 Document Revised: 06/16/2018 Document Reviewed: 06/16/2018 Elsevier Interactive Patient Education  2019 [employer].

## 2019-01-09 LAB — CERVICOVAGINAL ANCILLARY ONLY
Bacterial vaginitis: NEGATIVE
Candida vaginitis: NEGATIVE
Chlamydia: NEGATIVE
Neisseria Gonorrhea: NEGATIVE
Trichomonas: POSITIVE — AB

## 2019-01-09 LAB — BMP8+EGFR
BUN / CREAT RATIO: 7 — AB (ref 9–23)
BUN: 6 mg/dL (ref 6–20)
CO2: 24 mmol/L (ref 20–29)
CREATININE: 0.87 mg/dL (ref 0.57–1.00)
Calcium: 9.5 mg/dL (ref 8.7–10.2)
Chloride: 103 mmol/L (ref 96–106)
GFR calc Af Amer: 108 mL/min/{1.73_m2} (ref >59)
GFR calc non Af Amer: 94 mL/min/{1.73_m2} (ref >59)
Glucose: 83 mg/dL (ref 65–99)
Potassium: 4.7 mmol/L (ref 3.5–5.2)
SODIUM: 136 mmol/L (ref 134–144)

## 2019-01-09 LAB — CBC
HEMATOCRIT: 41.8 % (ref 34.0–46.6)
Hemoglobin: 14.4 g/dL (ref 11.1–15.9)
MCH: 30 pg (ref 26.6–33.0)
MCHC: 34.4 g/dL (ref 31.5–35.7)
MCV: 87 fL (ref 79–97)
PLATELETS: 308 10*3/uL (ref 150–450)
RBC: 4.8 x10E6/uL (ref 3.77–5.28)
RDW: 12.1 % (ref 11.7–15.4)
WBC: 9.6 10*3/uL (ref 3.4–10.8)

## 2019-01-09 LAB — HEMOGLOBIN A1C
Est. average glucose Bld gHb Est-mCnc: 117 mg/dL
Hgb A1c MFr Bld: 5.7 % — ABNORMAL HIGH (ref 4.8–5.6)

## 2019-01-09 LAB — HEPATITIS C ANTIBODY: Hep C Virus Ab: <0.1 s/co ratio (ref 0.0–0.9)

## 2019-01-09 LAB — RPR: RPR Ser Ql: NONREACTIVE

## 2019-01-09 LAB — HIV ANTIBODY (ROUTINE TESTING W REFLEX): HIV Screen 4th Generation wRfx: NONREACTIVE

## 2019-01-09 LAB — HEPATITIS B SURFACE ANTIGEN: Hepatitis B Surface Ag: NEGATIVE

## 2019-01-09 NOTE — Progress Notes (Signed)
GYNECOLOGY ANNUAL PREVENTATIVE CARE ENCOUNTER NOTE  History:     Darianny Frangipane is a 23 y.o. G0P0000 female here for a routine annual gynecologic exam.  Current complaints: vaginal discharge and dysuria.   Denies abnormal vaginal bleeding, pelvic pain, problems with intercourse or other gynecologic concerns. She is currently not on contraception, uses condoms occasionally. Patient request STD screening. She does not perform self breast examinations.    Gynecologic History Patient's last menstrual period was 01/01/2019 (exact date). Contraception: condoms Last Pap: 08/2015. Results were: normal with negative HPV per our records   Obstetric History OB History  Gravida Para Term Preterm AB Living  0 0 0 0 0 0  SAB TAB Ectopic Multiple Live Births  0 0 0 0      Past Medical History:  Diagnosis Date  . Depression   . Diabetes mellitus without complication (HCC)   . Headache(784.0)   . Migraine   . Obesity   . Papilledema    Bilateral  . Prediabetes     History reviewed. No pertinent surgical history.  Current Outpatient Medications on File Prior to Visit  Medication Sig Dispense Refill  . fluticasone (FLONASE) 50 MCG/ACT nasal spray Place 2 sprays into both nostrils daily. 17 g 0   No current facility-administered medications on file prior to visit.     No Known Allergies  Social History:  reports that she has never smoked. She has never used smokeless tobacco. She reports current alcohol use. She reports that she does not use drugs.  Family History  Problem Relation Age of Onset  . Diabetes Mother   . Diabetes Sister   . Diabetes Maternal Aunt   . Hypertension Maternal Grandmother   . Diabetes Maternal Grandmother     The following portions of the patient's history were reviewed and updated as appropriate: allergies, current medications, past family history, past medical history, past social history, past surgical history and problem list.  Review of  Systems Pertinent items noted in HPI and remainder of comprehensive ROS otherwise negative.  Physical Exam:  BP 125/81   Pulse 71   Wt 253 lb 3.2 oz (114.9 kg)   LMP 01/01/2019 (Exact Date)   BMI 44.85 kg/m  CONSTITUTIONAL: Well-developed, obese female in no acute distress.  HENT:  Normocephalic, atraumatic, External right and left ear normal. Oropharynx is clear and moist EYES: Conjunctivae and EOM are normal. Pupils are equal, round, and reactive to light. NECK: Normal range of motion, supple, no masses.  Normal thyroid.  SKIN: Skin is warm and dry. No rash noted. Not diaphoretic. No erythema. No pallor. MUSCULOSKELETAL: Normal range of motion. No tenderness.  No cyanosis, clubbing, or edema.  2+ distal pulses. NEUROLOGIC: Alert and oriented to person, place, and time. Normal reflexes, muscle tone coordination. No cranial nerve deficit noted. PSYCHIATRIC: Normal mood and affect. Normal behavior. Normal judgment and thought content. CARDIOVASCULAR: Normal heart rate noted, regular rhythm RESPIRATORY: Clear to auscultation bilaterally. Effort and breath sounds normal, no problems with respiration noted. BREASTS: Symmetric in size. No masses, skin changes, nipple drainage, or lymphadenopathy. ABDOMEN: Soft, normal bowel sounds, no distention noted.  No tenderness, rebound or guarding.  PELVIC: Normal appearing external genitalia; normal appearing vaginal mucosa and cervix.  Moderate amount of white thin discharge with foul odor present.  Pap smear obtained.  Normal uterine size, no other palpable masses, no uterine or adnexal tenderness.   Assessment and Plan:    1. Encounter for annual routine gynecological examination -  Normal well woman examination  - Cytology - PAP( [facility])  2. Vaginal discharge - Patient reports vaginal discharge and dysuria have been occurring for the past 2 weeks, describes the discharge as whitish gray thin discharge with odor - Reports using condoms  occasionally  - Cervicovaginal ancillary only( [place])  3. Dysuria - Urine Culture - POCT Urinalysis Dipstick  4. Screening for STDs (sexually transmitted diseases) - HIV antibody (with reflex) - Hepatitis B surface antigen - Hepatitis C antibody - RPR  Will follow up results of pap smear and manage accordingly. Routine preventative health maintenance measures emphasized. Please refer to After Visit Summary for other counseling recommendations.      Sharyon Cable, CNM [facility]

## 2019-01-10 LAB — URINE CULTURE

## 2019-01-12 LAB — CYTOLOGY - PAP: Diagnosis: NEGATIVE

## 2019-01-12 MED ORDER — TINIDAZOLE 500 MG PO TABS: 2.0000 g | ORAL_TABLET | Freq: Once | ORAL | 0 refills | Status: AC

## 2019-01-12 MED ORDER — SULFAMETHOXAZOLE-TRIMETHOPRIM 800-160 MG PO TABS: 1.0000 | ORAL_TABLET | Freq: Two times a day (BID) | ORAL | 0 refills | Status: DC

## 2019-01-12 NOTE — Addendum Note (Signed)
Addended by: Sharyon Cable on: 01/12/2019 11:52 AM   Modules accepted: Orders

## 2019-01-21 ENCOUNTER — Ambulatory Visit: Payer: BLUE CROSS/BLUE SHIELD | Admitting: Certified Nurse Midwife

## 2019-02-19 ENCOUNTER — Ambulatory Visit (INDEPENDENT_AMBULATORY_CARE_PROVIDER_SITE_OTHER): Payer: BLUE CROSS/BLUE SHIELD | Admitting: Nurse Practitioner

## 2019-02-19 ENCOUNTER — Ambulatory Visit: Payer: BLUE CROSS/BLUE SHIELD | Admitting: Nurse Practitioner

## 2019-02-19 ENCOUNTER — Encounter: Payer: Self-pay | Admitting: Nurse Practitioner

## 2019-02-19 ENCOUNTER — Other Ambulatory Visit: Payer: Self-pay

## 2019-02-19 VITALS — BP 120/68 | HR 75 | Temp 98.1°F | Ht 65.4 in | Wt 253.0 lb

## 2019-02-19 DIAGNOSIS — R42 Dizziness and giddiness: Secondary | ICD-10-CM | POA: Diagnosis not present

## 2019-02-19 DIAGNOSIS — R413 Other amnesia: Secondary | ICD-10-CM

## 2019-02-19 NOTE — Progress Notes (Signed)
  Subjective:     Patient ID: Melissa Chang , female    DOB: Mar 08, 1996 , 23 y.o.   MRN: [id number]   Chief Complaint  Patient presents with  . Dizziness    HPI  She does not feel her dizziness is any better.  She says she drinks approx 128 oz of water  Per day.   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  6 months employed at [employer].  She does not recall having any issues in school with completing tasks.  Noticed more at work.  At this time not affecting her work load.    Dizziness  This is a new problem. The current episode started more than 1 month ago. The problem occurs intermittently. The problem has been waxing and waning. Pertinent negatives include no chest pain, coughing or fatigue.     Past Medical History:  Diagnosis Date  . Depression   . Diabetes mellitus without complication (HCC)   . Headache(784.0)   . Migraine   . Obesity   . Papilledema    Bilateral  . Prediabetes      Family History  Problem Relation Age of Onset  . Diabetes Mother   . Diabetes Sister   . Diabetes Maternal Aunt   . Hypertension Maternal Grandmother   . Diabetes Maternal Grandmother     No current outpatient medications on file.   No Known Allergies   Review of Systems  Constitutional: Negative for fatigue.  Respiratory: Negative for cough.   Cardiovascular: Negative.  Negative for chest pain, palpitations and leg swelling.  Endocrine: Negative for polydipsia, polyphagia and polyuria.  Neurological: Positive for dizziness. Negative for light-headedness.     Today's Vitals   02/19/19 1029  BP: 120/68  Pulse: 75  Temp: 98.1 F (36.7 C)  TempSrc: Oral  SpO2: 98%  Weight: 253 lb (114.8 kg)  Height: 5' 5.4" (1.661 m)   Body mass index is 41.59 kg/m.   Objective:  Physical Exam Vitals signs reviewed.  Constitutional:      Appearance: Normal appearance. She is not ill-appearing.  HENT:     Right Ear: Tympanic  membrane, ear canal and external ear normal. There is no impacted cerumen.     Left Ear: Tympanic membrane, ear canal and external ear normal. There is no impacted cerumen.  Cardiovascular:     Rate and Rhythm: Normal rate and regular rhythm.     Pulses: Normal pulses.     Heart sounds: Normal heart sounds. No murmur.  Pulmonary:     Effort: Pulmonary effort is normal.     Breath sounds: Normal breath sounds.  Skin:    General: Skin is warm and dry.     Capillary Refill: Capillary refill takes less than 2 seconds.  Neurological:     General: No focal deficit present.     Mental Status: She is alert and oriented to person, place, and time.         Assessment And Plan:     1. Memory deficit  Will check for metabolic causes  Anxiety vs Memory Impairment (least likely) vs depression - TSH - Vitamin B12  2. Dizziness  Will check metabolic causes,  Blood pressure is normal.    Encouraged to continue with adequate water intake.    Arnette Felts, FNP    THE PATIENT IS ENCOURAGED TO PRACTICE SOCIAL DISTANCING DUE TO THE COVID-19 PANDEMIC.

## 2019-02-20 LAB — TSH: TSH: 0.821 u[IU]/mL (ref 0.450–4.500)

## 2019-02-20 LAB — VITAMIN B12: Vitamin B-12: 644 pg/mL (ref 232–1245)

## 2019-02-23 ENCOUNTER — Encounter: Payer: Self-pay | Admitting: Nurse Practitioner

## 2019-03-08 ENCOUNTER — Encounter: Payer: Self-pay | Admitting: Nurse Practitioner

## 2019-03-08 DIAGNOSIS — R413 Other amnesia: Secondary | ICD-10-CM | POA: Insufficient documentation

## 2019-04-11 ENCOUNTER — Encounter: Payer: Self-pay | Admitting: Nurse Practitioner

## 2019-04-13 ENCOUNTER — Ambulatory Visit (INDEPENDENT_AMBULATORY_CARE_PROVIDER_SITE_OTHER): Payer: BLUE CROSS/BLUE SHIELD | Admitting: Nurse Practitioner

## 2019-04-13 ENCOUNTER — Other Ambulatory Visit: Payer: Self-pay

## 2019-04-13 ENCOUNTER — Other Ambulatory Visit: Payer: BLUE CROSS/BLUE SHIELD

## 2019-04-13 ENCOUNTER — Encounter: Payer: Self-pay | Admitting: Nurse Practitioner

## 2019-04-13 ENCOUNTER — Telehealth: Payer: Self-pay

## 2019-04-13 VITALS — Wt 230.0 lb

## 2019-04-13 DIAGNOSIS — Z20822 Contact with and (suspected) exposure to covid-19: Secondary | ICD-10-CM

## 2019-04-13 DIAGNOSIS — U071 COVID-19: Secondary | ICD-10-CM | POA: Diagnosis not present

## 2019-04-13 DIAGNOSIS — Z20828 Contact with and (suspected) exposure to other viral communicable diseases: Secondary | ICD-10-CM

## 2019-04-13 MED ORDER — AZITHROMYCIN 250 MG PO TABS: ORAL_TABLET | ORAL | 0 refills | Status: AC

## 2019-04-13 MED ORDER — ALBUTEROL SULFATE HFA 108 (90 BASE) MCG/ACT IN AERS: 2.0000 | INHALATION_SPRAY | Freq: Four times a day (QID) | RESPIRATORY_TRACT | 2 refills | Status: DC | PRN

## 2019-04-13 NOTE — Telephone Encounter (Signed)
Patient consented to [place] notifying her employer that she tested positive for the virus. YRL,RMA

## 2019-04-13 NOTE — Telephone Encounter (Signed)
Brennan Bailey, CMA at [facility] called to request the patient be scheduled for COVID testing. I advised I will call the patient. I called the patient and advised of the call, she verbalized understanding. Appointment scheduled for today at 1545 at [facility], advised of location and for everyone in the vehicle to wear a mask, she verbalized understanding. Orders placed.

## 2019-04-13 NOTE — Progress Notes (Signed)
Virtual Visit via Virtual video   This visit type was conducted due to national recommendations for restrictions regarding the COVID-19 Pandemic (e.g. social distancing) in an effort to limit this patient's exposure and mitigate transmission in our community.  Patients identity confirmed using two different identifiers.  This format is felt to be most appropriate for this patient at this time.  All issues noted in this document were discussed and addressed.  No physical exam was performed (except for noted visual exam findings with Video Visits).    Date:  04/13/2019   ID:  Melissa Chang, DOB 26-Mar-1996, MRN [id number]  Patient Location:  Home - spoke with Melissa Chang  Provider location:   Office    Chief Complaint:  COVID- 19 positive   History of Present Illness:    Melissa Chang is a 23 y.o. female who presents via video conferencing for a telehealth visit today.    The patient does have symptoms concerning for COVID-19 infection (fever, chills, cough, or new shortness of breath).   She   She is to get tested on June 15th and return to work on June 17.  Entire family is positive.      Past Medical History:  Diagnosis Date  . Depression   . Diabetes mellitus without complication (HCC)   . Headache(784.0)   . Migraine   . Obesity   . Papilledema    Bilateral  . Prediabetes    History reviewed. No pertinent surgical history.   No outpatient medications have been marked as taking for the 04/13/19 encounter (Office Visit) with Arnette Felts, FNP.     Allergies:   Patient has no known allergies.   Social History   Tobacco Use  . Smoking status: Never Smoker  . Smokeless tobacco: Never Used  Substance Use Topics  . Alcohol use: Yes    Alcohol/week: 0.0 standard drinks    Comment: socially  . Drug use: No     Family Hx: The patient's family history includes Diabetes in her maternal aunt, maternal grandmother, mother, and sister; Hypertension in her maternal  grandmother.  ROS:   Please see the history of present illness.    ROS  All other systems reviewed and are negative.   Labs/Other Tests and Data Reviewed:    Recent Labs: 09/18/2018: ALT 14 01/08/2019: BUN 6; Creatinine, Ser 0.87; Hemoglobin 14.4; Platelets 308; Potassium 4.7; Sodium 136 02/19/2019: TSH 0.821   Recent Lipid Panel Lab Results  Component Value Date/Time   CHOL 191 05/18/2015 05:06 PM   TRIG 133 05/18/2015 05:06 PM   HDL 55 05/18/2015 05:06 PM    Wt Readings from Last 3 Encounters:  04/13/19 230 lb (104.3 kg)  02/19/19 253 lb (114.8 kg)  01/08/19 250 lb 6.4 oz (113.6 kg)     Exam:    Vital Signs:  Wt 230 lb (104.3 kg)   LMP 03/24/2019   BMI 37.81 kg/m     Physical Exam  Constitutional: She is well-developed, well-nourished, and in no distress.  Pulmonary/Chest: Effort normal.  Psychiatric: Mood, memory, affect and judgment normal.    ASSESSMENT & PLAN:    1. Close Exposure to Covid-19 Virus  She was exposed to her mother who is positive for COVID 19, she has gone to [facility] on [address] and tells me she is positive as well.   She is advised if she has worsening symptoms to return call to the office.  - azithromycin (ZITHROMAX) 250 MG tablet; Take 2 tablets (500  mg) on  Day 1,  followed by 1 tablet (250 mg) once daily on Days 2 through 5.  Dispense: 6 each; Refill: 0 - MyChart COVID-19 home monitoring program - Temperature monitoring; Future   COVID-19 Education: The signs and symptoms of COVID-19 were discussed with the patient and how to seek care for testing (follow up with PCP or arrange E-visit).  The importance of social distancing was discussed today.  Patient Risk:   After full review of this patients clinical status, I feel that they are at least moderate risk at this time.  Time:   Today, I have spent 15 minutes/ seconds with the patient with telehealth technology discussing above diagnoses.     Medication Adjustments/Labs and Tests  Ordered: Current medicines are reviewed at length with the patient today.  Concerns regarding medicines are outlined above.   Tests Ordered: No orders of the defined types were placed in this encounter.   Medication Changes: Meds ordered this encounter  Medications  . azithromycin (ZITHROMAX) 250 MG tablet    Sig: Take 2 tablets (500 mg) on  Day 1,  followed by 1 tablet (250 mg) once daily on Days 2 through 5.    Dispense:  6 each    Refill:  0  . albuterol (VENTOLIN HFA) 108 (90 Base) MCG/ACT inhaler    Sig: Inhale 2 puffs into the lungs every 6 (six) hours as needed for wheezing or shortness of breath.    Dispense:  1 Inhaler    Refill:  2    Disposition:  Follow up prn  Signed, Arnette FeltsJanece Makara Lanzo, FNP

## 2019-04-14 ENCOUNTER — Telehealth: Payer: Self-pay

## 2019-04-14 ENCOUNTER — Encounter (INDEPENDENT_AMBULATORY_CARE_PROVIDER_SITE_OTHER): Payer: Self-pay

## 2019-04-14 ENCOUNTER — Telehealth: Payer: Self-pay | Admitting: *Deleted

## 2019-04-14 NOTE — Telephone Encounter (Signed)
Left pt v/m to call the office we wanted to see hoe she is feeling. YRL,RMA

## 2019-04-14 NOTE — Telephone Encounter (Signed)
Thank you, we will call her

## 2019-04-14 NOTE — Telephone Encounter (Signed)
Contacted pt regarding my chart encounter response of worsening appetite; the pt says that she has no appetite, and when she does eat the food has no taste; pt advised to drink at least 8-10 8 oz glasses of liquids daily, and to try frequent smaller meals; pt also instructed to contact her PCP for further recommendations; she verbalized understanding; will route to provider for notification.

## 2019-04-15 ENCOUNTER — Telehealth: Payer: Self-pay

## 2019-04-15 NOTE — Telephone Encounter (Signed)
Called pt see how she is doing she stated she is just waking up and she is unsure of how she feels just yet I told her if she is still having loss of appetite to try to eat small meals and drink fluids and if she starts to have chest pain or SOB to go to the ER. YRL,RMA

## 2019-04-16 ENCOUNTER — Encounter (INDEPENDENT_AMBULATORY_CARE_PROVIDER_SITE_OTHER): Payer: Self-pay

## 2019-04-17 ENCOUNTER — Encounter: Payer: Self-pay | Admitting: Nurse Practitioner

## 2019-04-23 ENCOUNTER — Telehealth: Payer: Self-pay

## 2019-04-23 NOTE — Telephone Encounter (Signed)
I called pt to see how she was doing she stated she is doing good she has gotten her appetite back and I also advised her that we have faxed over her forms and also mailed her out a copy. YRL,RMA

## 2019-04-29 ENCOUNTER — Encounter: Payer: Self-pay | Admitting: Nurse Practitioner

## 2019-06-12 ENCOUNTER — Encounter: Payer: Self-pay | Admitting: Nurse Practitioner

## 2019-06-15 NOTE — Telephone Encounter (Signed)
She needs a virtual visit set up for a referral to pulmonologist

## 2019-07-02 ENCOUNTER — Encounter: Payer: Self-pay | Admitting: Nurse Practitioner

## 2019-07-02 ENCOUNTER — Ambulatory Visit (INDEPENDENT_AMBULATORY_CARE_PROVIDER_SITE_OTHER): Payer: BC Managed Care – PPO | Admitting: Nurse Practitioner

## 2019-07-02 ENCOUNTER — Other Ambulatory Visit: Payer: Self-pay

## 2019-07-02 VITALS — Ht 64.0 in | Wt 258.0 lb

## 2019-07-02 DIAGNOSIS — Z8619 Personal history of other infectious and parasitic diseases: Secondary | ICD-10-CM | POA: Diagnosis not present

## 2019-07-02 DIAGNOSIS — Z8616 Personal history of COVID-19: Secondary | ICD-10-CM

## 2019-07-02 DIAGNOSIS — R0609 Other forms of dyspnea: Secondary | ICD-10-CM

## 2019-07-02 NOTE — Progress Notes (Signed)
Virtual Visit via Video   This visit type was conducted due to national recommendations for restrictions regarding the COVID-19 Pandemic (e.g. social distancing) in an effort to limit this patient's exposure and mitigate transmission in our community.  Due to her co-morbid illnesses, this patient is at least at moderate risk for complications without adequate follow up.  This format is felt to be most appropriate for this patient at this time.  All issues noted in this document were discussed and addressed.  A limited physical exam was performed with this format.    This visit type was conducted due to national recommendations for restrictions regarding the COVID-19 Pandemic (e.g. social distancing) in an effort to limit this patient's exposure and mitigate transmission in our community.  Patients identity confirmed using two different identifiers.  This format is felt to be most appropriate for this patient at this time.  All issues noted in this document were discussed and addressed.  No physical exam was performed (except for noted visual exam findings with Video Visits).    Date:  07/02/2019   ID:  Melissa Chang, DOB 1996/06/28, MRN [id number]  Patient Location:  Home - spoke with Melissa Chang  Provider location:   Office    Chief Complaint:  Shortness of breath  History of Present Illness:    Melissa Chang is a 23 y.o. female who presents via video conferencing for a telehealth visit today.    The patient does not have symptoms concerning for COVID-19 infection (fever, chills, cough, or new shortness of breath).   She had COVID in May 31st she had a repeat test which was negative.  She was out of work for 1 month, since that time she has had shortness of breath with exertion.  Will use an inhaler two times per week.  Will occur when sitting still.    Shortness of Breath This is a chronic problem. The current episode started more than 1 month ago. The problem occurs  intermittently. The problem has been gradually worsening. Pertinent negatives include no abdominal pain, fever or headaches. Nothing aggravates the symptoms. She has tried ipratropium inhalers for the symptoms. The treatment provided no relief.     Past Medical History:  Diagnosis Date  . Depression   . Diabetes mellitus without complication ([place])   . Headache(784.0)   . Migraine   . Obesity   . Papilledema    Bilateral  . Prediabetes    History reviewed. No pertinent surgical history.   Current Meds  Medication Sig  . albuterol (VENTOLIN HFA) 108 (90 Base) MCG/ACT inhaler Inhale 2 puffs into the lungs every 6 (six) hours as needed for wheezing or shortness of breath.     Allergies:   Patient has no known allergies.   Social History   Tobacco Use  . Smoking status: Never Smoker  . Smokeless tobacco: Never Used  Substance Use Topics  . Alcohol use: Yes    Alcohol/week: 0.0 standard drinks    Comment: socially  . Drug use: No     Family Hx: The patient's family history includes Diabetes in her maternal aunt, maternal grandmother, mother, and sister; Hypertension in her maternal grandmother.  ROS:   Please see the history of present illness.    Review of Systems  Constitutional: Negative for chills and fever.  Respiratory: Positive for shortness of breath.   Gastrointestinal: Negative for abdominal pain.  Neurological: Negative for headaches.    All other systems reviewed and are negative.  Labs/Other Tests and Data Reviewed:    Recent Labs: 09/18/2018: ALT 14 01/08/2019: BUN 6; Creatinine, Ser 0.87; Hemoglobin 14.4; Platelets 308; Potassium 4.7; Sodium 136 02/19/2019: TSH 0.821   Recent Lipid Panel Lab Results  Component Value Date/Time   CHOL 191 05/18/2015 05:06 PM   TRIG 133 05/18/2015 05:06 PM   HDL 55 05/18/2015 05:06 PM    Wt Readings from Last 3 Encounters:  07/02/19 258 lb (117 kg)  04/13/19 230 lb (104.3 kg)  02/19/19 253 lb (114.8 kg)      Exam:    Vital Signs:  Ht 5\' 4"  (1.626 m)   Wt 258 lb (117 kg)   LMP 06/22/2019   BMI 44.29 kg/m     Physical Exam  Constitutional: She is oriented to person, place, and time and well-developed, well-nourished, and in no distress.  Neurological: She is alert and oriented to person, place, and time.  Psychiatric: Mood, memory, affect and judgment normal.    ASSESSMENT & PLAN:    1. Dyspnea on exertion  She is having intermittent dyspnea mostly on exertion since having coronavirus  She would like to be seen by a pulmonologist.  I will make a referral to Dr. Sherene SiresWert - Ambulatory referral to Pulmonology  2. History of 2019 novel coronavirus disease (COVID-19)     COVID-19 Education: The signs and symptoms of COVID-19 were discussed with the patient and how to seek care for testing (follow up with PCP or arrange E-visit).  The importance of social distancing was discussed today.  Patient Risk:   After full review of this patients clinical status, I feel that they are at least moderate risk at this time.    Medication Adjustments/Labs and Tests Ordered: Current medicines are reviewed at length with the patient today.  Concerns regarding medicines are outlined above.   Tests Ordered: Orders Placed This Encounter  Procedures  . Ambulatory referral to Pulmonology    Medication Changes: No orders of the defined types were placed in this encounter.   Disposition:  Follow up prn  Signed, Arnette FeltsJanece Baley Lorimer, FNP

## 2019-07-09 ENCOUNTER — Ambulatory Visit: Payer: BLUE CROSS/BLUE SHIELD | Admitting: Nurse Practitioner

## 2019-09-22 ENCOUNTER — Telehealth: Payer: Self-pay

## 2019-09-22 ENCOUNTER — Institutional Professional Consult (permissible substitution): Payer: BLUE CROSS/BLUE SHIELD | Admitting: Internal Medicine

## 2019-09-22 NOTE — Telephone Encounter (Signed)
The likelihood of getting coronavirus a second time this soon after.

## 2019-09-22 NOTE — Telephone Encounter (Signed)
I called patient because swe received notes from [facility] stating she got tested for covid and we just wanted to see what was going on with pt. YRL,RMA

## 2019-09-22 NOTE — Telephone Encounter (Signed)
Patient stated she went to [place] and when she came back she stated feeling like she had symptoms so she went to get tested and she stated it was negative. YRL,RMA

## 2019-10-14 ENCOUNTER — Institutional Professional Consult (permissible substitution): Payer: Self-pay | Admitting: Internal Medicine

## 2019-10-23 ENCOUNTER — Encounter: Payer: Self-pay | Admitting: Nurse Practitioner

## 2019-10-27 ENCOUNTER — Telehealth (INDEPENDENT_AMBULATORY_CARE_PROVIDER_SITE_OTHER): Payer: BC Managed Care – PPO | Admitting: Nurse Practitioner

## 2019-10-27 ENCOUNTER — Other Ambulatory Visit: Payer: Self-pay

## 2019-10-27 ENCOUNTER — Encounter: Payer: Self-pay | Admitting: Nurse Practitioner

## 2019-10-27 VITALS — Wt 235.0 lb

## 2019-10-27 DIAGNOSIS — Z20822 Contact with and (suspected) exposure to covid-19: Secondary | ICD-10-CM

## 2019-10-27 DIAGNOSIS — Z20828 Contact with and (suspected) exposure to other viral communicable diseases: Secondary | ICD-10-CM

## 2019-10-27 DIAGNOSIS — R7303 Prediabetes: Secondary | ICD-10-CM

## 2019-10-27 DIAGNOSIS — Z0289 Encounter for other administrative examinations: Secondary | ICD-10-CM

## 2019-10-27 NOTE — Progress Notes (Signed)
Virtual Visit via Video   This visit type was conducted due to national recommendations for restrictions regarding the COVID-19 Pandemic (e.g. social distancing) in an effort to limit this patient's exposure and mitigate transmission in our community.  Due to her co-morbid illnesses, this patient is at least at moderate risk for complications without adequate follow up.  This format is felt to be most appropriate for this patient at this time.  All issues noted in this document were discussed and addressed.  A limited physical exam was performed with this format.    This visit type was conducted due to national recommendations for restrictions regarding the COVID-19 Pandemic (e.g. social distancing) in an effort to limit this patient's exposure and mitigate transmission in our community.  Patients identity confirmed using two different identifiers.  This format is felt to be most appropriate for this patient at this time.  All issues noted in this document were discussed and addressed.  No physical exam was performed (except for noted visual exam findings with Video Visits).    Date:  11/01/2019   ID:  Melissa Chang, DOB 1996-05-16, MRN [id number]  Patient Location:  Home - spoke with Melissa Chang  Provider location:   Office    Chief Complaint:  Needs note to return to work related to exposure to coronavirus  History of Present Illness:    Melissa Chang is a 23 y.o. female who presents via video conferencing for a telehealth visit today.    The patient does not have symptoms concerning for COVID-19 infection (fever, chills, cough, or new shortness of breath).   Virtual visit done today due to needing note to return to work after exposure to coronavirus.  She was exposed to someone with coronavirus at the beginning of November.  She has been out since the beginning of November, she was tested at [facility] on 09/17/2019 which was negative. She works for [employer].  No current  symptoms.      Past Medical History:  Diagnosis Date  . Depression   . Diabetes mellitus without complication ([name])   . Headache(784.0)   . Migraine   . Obesity   . Papilledema    Bilateral  . Prediabetes    History reviewed. No pertinent surgical history.   Current Meds  Medication Sig  . albuterol (VENTOLIN HFA) 108 (90 Base) MCG/ACT inhaler Inhale 2 puffs into the lungs every 6 (six) hours as needed for wheezing or shortness of breath.     Allergies:   Patient has no known allergies.   Social History   Tobacco Use  . Smoking status: Never Smoker  . Smokeless tobacco: Never Used  Substance Use Topics  . Alcohol use: Yes    Alcohol/week: 0.0 standard drinks    Comment: socially  . Drug use: No     Family Hx: The patient's family history includes Diabetes in her maternal aunt, maternal grandmother, mother, and sister; Hypertension in her maternal grandmother.  ROS:   Please see the history of present illness.    Review of Systems  Constitutional: Negative.   Respiratory: Negative.   Cardiovascular: Negative.   Neurological: Negative for dizziness and tingling.    All other systems reviewed and are negative.   Labs/Other Tests and Data Reviewed:    Recent Labs: 01/08/2019: Hemoglobin 14.4; Platelets 308 02/19/2019: TSH 0.821 10/29/2019: ALT 12; BUN 11; Creatinine, Ser 0.92; Potassium 4.5; Sodium 138   Recent Lipid Panel Lab Results  Component Value Date/Time   CHOL  193 10/29/2019 05:43 PM   TRIG 110 10/29/2019 05:43 PM   HDL 59 10/29/2019 05:43 PM   CHOLHDL 3.3 10/29/2019 05:43 PM   LDLCALC 114 (H) 10/29/2019 05:43 PM    Wt Readings from Last 3 Encounters:  10/27/19 235 lb (106.6 kg)  07/02/19 258 lb (117 kg)  04/13/19 230 lb (104.3 kg)     Exam:    Vital Signs:  Wt 235 lb (106.6 kg)   LMP 10/14/2019   BMI 40.34 kg/m     Physical Exam  Constitutional: She is oriented to person, place, and time and well-developed, well-nourished, and in no  distress. No distress.  Pulmonary/Chest: Effort normal. No respiratory distress.  Neurological: She is alert and oriented to person, place, and time.  Psychiatric: Mood, memory, affect and judgment normal.    ASSESSMENT & PLAN:     1. Close exposure to COVID-19 virus  Was exposed to someone who was positive for coronavirus November 2nd, she [facility] which was [facility].  Has not had any symptoms and has been home from work since that time.    She is cleared to return to work without any restrictions.    2. Prediabetes  Chronic stable  Will check HgbA1c and kidney functions  Encouraged to limit intake of sugary foods and drinks - Lipid Profile; Future - Hemoglobin A1c; Future - BMP8+eGFR; Future   COVID-19 Education: The signs and symptoms of COVID-19 were discussed with the patient and how to seek care for testing (follow up with PCP or arrange E-visit).  The importance of social distancing was discussed today.  Patient Risk:   After full review of this patients clinical status, I feel that they are at least moderate risk at this time.  Time:   Today, I have spent 8 minutes/ seconds with the patient with telehealth technology discussing above diagnoses.     Medication Adjustments/Labs and Tests Ordered: Current medicines are reviewed at length with the patient today.  Concerns regarding medicines are outlined above.   Tests Ordered: Orders Placed This Encounter  Procedures  . Lipid Profile  . Hemoglobin A1c  . BMP8+eGFR    Medication Changes: No orders of the defined types were placed in this encounter.   Disposition:  Follow up Labs this week  Signed, Minette Brine, FNP

## 2019-10-29 ENCOUNTER — Other Ambulatory Visit: Payer: Self-pay

## 2019-10-29 ENCOUNTER — Other Ambulatory Visit: Payer: Self-pay | Admitting: Nurse Practitioner

## 2019-10-29 ENCOUNTER — Other Ambulatory Visit: Payer: BC Managed Care – PPO

## 2019-10-29 DIAGNOSIS — R7303 Prediabetes: Secondary | ICD-10-CM

## 2019-10-30 LAB — CMP14 + ANION GAP
ALT: 12 IU/L (ref 0–32)
AST: 16 IU/L (ref 0–40)
Albumin/Globulin Ratio: 1.8 (ref 1.2–2.2)
Albumin: 4.5 g/dL (ref 3.9–5.0)
Alkaline Phosphatase: 120 IU/L — ABNORMAL HIGH (ref 39–117)
Anion Gap: 13 mmol/L (ref 10.0–18.0)
BUN/Creatinine Ratio: 12 (ref 9–23)
BUN: 11 mg/dL (ref 6–20)
Bilirubin Total: 0.3 mg/dL (ref 0.0–1.2)
CO2: 23 mmol/L (ref 20–29)
Calcium: 9.3 mg/dL (ref 8.7–10.2)
Chloride: 102 mmol/L (ref 96–106)
Creatinine, Ser: 0.92 mg/dL (ref 0.57–1.00)
GFR calc Af Amer: 100 mL/min/1.73
GFR calc non Af Amer: 87 mL/min/1.73
Globulin, Total: 2.5 g/dL (ref 1.5–4.5)
Glucose: 85 mg/dL (ref 65–99)
Potassium: 4.5 mmol/L (ref 3.5–5.2)
Sodium: 138 mmol/L (ref 134–144)
Total Protein: 7 g/dL (ref 6.0–8.5)

## 2019-10-30 LAB — LIPID PANEL
Chol/HDL Ratio: 3.3 ratio (ref 0.0–4.4)
Cholesterol, Total: 193 mg/dL (ref 100–199)
HDL: 59 mg/dL
LDL Chol Calc (NIH): 114 mg/dL — ABNORMAL HIGH (ref 0–99)
Triglycerides: 110 mg/dL (ref 0–149)
VLDL Cholesterol Cal: 20 mg/dL (ref 5–40)

## 2019-10-30 LAB — HEMOGLOBIN A1C
Est. average glucose Bld gHb Est-mCnc: 120 mg/dL
Hgb A1c MFr Bld: 5.8 % — ABNORMAL HIGH (ref 4.8–5.6)

## 2019-11-02 LAB — HM PAP SMEAR

## 2020-01-15 ENCOUNTER — Encounter: Payer: BLUE CROSS/BLUE SHIELD | Admitting: Nurse Practitioner

## 2020-01-21 ENCOUNTER — Encounter: Payer: BC Managed Care – PPO | Admitting: Nurse Practitioner

## 2020-01-27 ENCOUNTER — Institutional Professional Consult (permissible substitution): Payer: Self-pay | Admitting: Internal Medicine

## 2020-02-09 ENCOUNTER — Encounter: Payer: BC Managed Care – PPO | Admitting: Nurse Practitioner

## 2020-03-28 ENCOUNTER — Encounter: Payer: Self-pay | Admitting: Nurse Practitioner

## 2020-03-31 ENCOUNTER — Ambulatory Visit: Payer: BLUE CROSS/BLUE SHIELD | Admitting: Internal Medicine

## 2020-04-12 ENCOUNTER — Encounter: Payer: Self-pay | Admitting: Nurse Practitioner

## 2020-04-14 ENCOUNTER — Ambulatory Visit (INDEPENDENT_AMBULATORY_CARE_PROVIDER_SITE_OTHER): Payer: BLUE CROSS/BLUE SHIELD | Admitting: Nurse Practitioner

## 2020-04-14 ENCOUNTER — Other Ambulatory Visit: Payer: Self-pay

## 2020-04-14 ENCOUNTER — Encounter: Payer: Self-pay | Admitting: Nurse Practitioner

## 2020-04-14 VITALS — Temp 98.2°F | Ht 64.6 in | Wt 261.8 lb

## 2020-04-14 DIAGNOSIS — H6123 Impacted cerumen, bilateral: Secondary | ICD-10-CM

## 2020-04-14 DIAGNOSIS — R519 Headache, unspecified: Secondary | ICD-10-CM | POA: Diagnosis not present

## 2020-04-14 DIAGNOSIS — R635 Abnormal weight gain: Secondary | ICD-10-CM

## 2020-04-14 DIAGNOSIS — R42 Dizziness and giddiness: Secondary | ICD-10-CM

## 2020-04-14 LAB — POCT URINALYSIS DIPSTICK
Bilirubin, UA: NEGATIVE
Glucose, UA: NEGATIVE
Ketones, UA: NEGATIVE
Leukocytes, UA: NEGATIVE
Nitrite, UA: NEGATIVE
Protein, UA: POSITIVE — AB
Spec Grav, UA: >=1.03 — AB (ref 1.010–1.025)
Urobilinogen, UA: 0.2 E.U./dL
pH, UA: 5.5 (ref 5.0–8.0)

## 2020-04-14 NOTE — Patient Instructions (Addendum)
Dizziness Dizziness is a common problem. It makes you feel unsteady or light-headed. You may feel like you are about to pass out (faint). Dizziness can lead to getting hurt if you stumble or fall. Dizziness can be caused by many things, including:  Medicines.  Not having enough water in your body (dehydration).  Illness. Follow these instructions at home: Eating and drinking   Drink enough fluid to keep your pee (urine) clear or pale yellow. This helps to keep you from getting dehydrated. Try to drink more clear fluids, such as water.  Do not drink alcohol.  Limit how much caffeine you drink or eat, if your doctor tells you to do that.  Limit how much salt (sodium) you drink or eat, if your doctor tells you to do that. Activity   Avoid making quick movements. ? When you stand up from sitting in a chair, steady yourself until you feel okay. ? In the morning, first sit up on the side of the bed. When you feel okay, stand slowly while you hold onto something. Do this until you know that your balance is fine.  If you need to stand in one place for a long time, move your legs often. Tighten and relax the muscles in your legs while you are standing.  Do not drive or use heavy machinery if you feel dizzy.  Avoid bending down if you feel dizzy. Place items in your home so you can reach them easily without leaning over. Lifestyle  Do not use any products that contain nicotine or tobacco, such as cigarettes and e-cigarettes. If you need help quitting, ask your doctor.  Try to lower your stress level. You can do this by using methods such as yoga or meditation. Talk with your doctor if you need help. General instructions  Watch your dizziness for any changes.  Take over-the-counter and prescription medicines only as told by your doctor. Talk with your doctor if you think that you are dizzy because of a medicine that you are taking.  Tell a friend or a family member that you are feeling  dizzy. If he or she notices any changes in your behavior, have this person call your doctor.  Keep all follow-up visits as told by your doctor. This is important. Contact a doctor if:  Your dizziness does not go away.  Your dizziness or light-headedness gets worse.  You feel sick to your stomach (nauseous).  You have trouble hearing.  You have new symptoms.  You are unsteady on your feet.  You feel like the room is spinning. Get help right away if:  You throw up (vomit) or have watery poop (diarrhea), and you cannot eat or drink anything.  You have trouble: ? Talking. ? Walking. ? Swallowing. ? Using your arms, hands, or legs.  You feel generally weak.  You are not thinking clearly, or you have trouble forming sentences. A friend or family member may notice this.  You have: ? Chest pain. ? Pain in your belly (abdomen). ? Shortness of breath. ? Sweating.  Your vision changes.  You are bleeding.  You have a very bad headache.  You have neck pain or a stiff neck.  You have a fever. These symptoms may be an emergency. Do not wait to see if the symptoms will go away. Get medical help right away. Call your local emergency services (911 in the [place] Do not drive yourself to the hospital. Summary  Dizziness makes you feel unsteady or light-headed. You   may feel like you are about to pass out (faint).  Drink enough fluid to keep your pee (urine) clear or pale yellow. Do not drink alcohol.  Avoid making quick movements if you feel dizzy.  Watch your dizziness for any changes. This information is not intended to replace advice given to you by your health care provider. Make sure you discuss any questions you have with your health care provider. Document Revised: 11/01/2017 Document Reviewed: 11/15/2016 Elsevier Patient Education  2020 Elsevier Inc. Earwax Buildup, Adult The ears produce a substance called earwax that helps keep bacteria out of the ear and protects  the skin in the ear canal. Occasionally, earwax can build up in the ear and cause discomfort or hearing loss. What increases the risk? This condition is more likely to develop in people who:  Are female.  Are elderly.  Naturally produce more earwax.  Clean their ears often with cotton swabs.  Use earplugs often.  Use in-ear headphones often.  Wear hearing aids.  Have narrow ear canals.  Have earwax that is overly thick or sticky.  Have eczema.  Are dehydrated.  Have excess hair in the ear canal. What are the signs or symptoms? Symptoms of this condition include:  Reduced or muffled hearing.  A feeling of fullness in the ear or feeling that the ear is plugged.  Fluid coming from the ear.  Ear pain.  Ear itch.  Ringing in the ear.  Coughing.  An obvious piece of earwax that can be seen inside the ear canal. How is this diagnosed? This condition may be diagnosed based on:  Your symptoms.  Your medical history.  An ear exam. During the exam, your health care provider will look into your ear with an instrument called an otoscope. You may have tests, including a hearing test. How is this treated? This condition may be treated by:  Using ear drops to soften the earwax.  Having the earwax removed by a health care provider. The health care provider may: ? Flush the ear with water. ? Use an instrument that has a loop on the end (curette). ? Use a suction device.  Surgery to remove the wax buildup. This may be done in severe cases. Follow these instructions at home:   Take over-the-counter and prescription medicines only as told by your health care provider.  Do not put any objects, including cotton swabs, into your ear. You can clean the opening of your ear canal with a washcloth or facial tissue.  Follow instructions from your health care provider about cleaning your ears. Do not over-clean your ears.  Drink enough fluid to keep your urine clear or pale  yellow. This will help to thin the earwax.  Keep all follow-up visits as told by your health care provider. If earwax builds up in your ears often or if you use hearing aids, consider seeing your health care provider for routine, preventive ear cleanings. Ask your health care provider how often you should schedule your cleanings.  If you have hearing aids, clean them according to instructions from the manufacturer and your health care provider. Contact a health care provider if:  You have ear pain.  You develop a fever.  You have blood, pus, or other fluid coming from your ear.  You have hearing loss.  You have ringing in your ears that does not go away.  Your symptoms do not improve with treatment.  You feel like the room is spinning (vertigo). Summary  Earwax can   build up in the ear and cause discomfort or hearing loss.  The most common symptoms of this condition include reduced or muffled hearing and a feeling of fullness in the ear or feeling that the ear is plugged.  This condition may be diagnosed based on your symptoms, your medical history, and an ear exam.  This condition may be treated by using ear drops to soften the earwax or by having the earwax removed by a health care provider.  Do not put any objects, including cotton swabs, into your ear. You can clean the opening of your ear canal with a washcloth or facial tissue. This information is not intended to replace advice given to you by your health care provider. Make sure you discuss any questions you have with your health care provider. Document Revised: 10/11/2017 Document Reviewed: 01/09/2017 Elsevier Patient Education  2020 Elsevier Inc.  

## 2020-04-14 NOTE — Progress Notes (Signed)
This visit occurred during the SARS-CoV-2 public health emergency.  Safety protocols were in place, including screening questions prior to the visit, additional usage of staff PPE, and extensive cleaning of exam room while observing appropriate contact time as indicated for disinfecting solutions.  Subjective:     Patient ID: Melissa Chang , female    DOB: February 03, 1996 , 24 y.o.   MRN: [id number]   Chief Complaint  Patient presents with  . Dizziness    patient stated she has been dizziness for the past 3 to 4 weeks now    HPI  She can be sitting down and everything seems like it is spinning. She reports a history of migraines and took medications as needed.  Denies having the same symptoms now as before when in high school.  Reports drinking approximately 120 oz water day.   Wt Readings from Last 3 Encounters: 04/14/20 : 261 lb 12.8 oz (118.8 kg) 10/27/19 : 235 lb (106.6 kg) 07/02/19 : 258 lb (117 kg)  Denies blurred vision.   Dizziness This is a new problem. The current episode started 1 to 4 weeks ago (3-4 weeks). The problem occurs intermittently (3-4 times out of the week). Associated symptoms include headaches and nausea. Pertinent negatives include no abdominal pain, chest pain, coughing or vomiting. She has tried nothing for the symptoms.     Past Medical History:  Diagnosis Date  . Depression   . Diabetes mellitus without complication ([place])   . Headache(784.0)   . Migraine   . Obesity   . Papilledema    Bilateral  . Prediabetes      Family History  Problem Relation Age of Onset  . Diabetes Mother   . Diabetes Sister   . Diabetes Maternal Aunt   . Hypertension Maternal Grandmother   . Diabetes Maternal Grandmother      Current Outpatient Medications:  .  albuterol (VENTOLIN HFA) 108 (90 Base) MCG/ACT inhaler, Inhale 2 puffs into the lungs every 6 (six) hours as needed for wheezing or shortness of breath., Disp: 1 Inhaler, Rfl: 2   No Known Allergies    Review of Systems  Respiratory: Positive for shortness of breath. Negative for cough.   Cardiovascular: Negative.  Negative for chest pain, palpitations and leg swelling.  Gastrointestinal: Positive for nausea. Negative for abdominal pain and vomiting.  Endocrine: Negative for polydipsia, polyphagia and polyuria.  Neurological: Positive for dizziness and headaches.    Today's Vitals   04/14/20 1559  Temp: 98.2 F (36.8 C)  TempSrc: Oral  Weight: 261 lb 12.8 oz (118.8 kg)  Height: 5' 4.6" (1.641 m)  PainSc: 0-No pain   Body mass index is 44.11 kg/m.   Objective:  Physical Exam Constitutional:      General: She is not in acute distress.    Appearance: Normal appearance.  Neurological:     General: No focal deficit present.     Mental Status: She is alert.  Psychiatric:        Mood and Affect: Mood normal.        Behavior: Behavior normal.        Thought Content: Thought content normal.        Judgment: Judgment normal.         Assessment And Plan:     1. Dizziness  orthostats are normal  Advised to stay well hydrated with water  May be related to possible migraines - POCT Urinalysis Dipstick (81002)  2. Acute nonintractable headache, unspecified headache type  Intermittent headaches  Encouraged to take excedrin migraine with the onset of her headaches  3. Bilateral impacted cerumen  Water lavage done  4. Weight gain  Due to her approximately 26 lb weight gain since December this can be concerning for pseudotumor cerebri with the headaches and dizziness. I will refer to ophthalmology for further evaluation          Arnette Felts, FNP    THE PATIENT IS ENCOURAGED TO PRACTICE SOCIAL DISTANCING DUE TO THE COVID-19 PANDEMIC.

## 2020-04-15 LAB — HEMOGLOBIN A1C
Est. average glucose Bld gHb Est-mCnc: 114 mg/dL
Hgb A1c MFr Bld: 5.6 % (ref 4.8–5.6)

## 2020-04-15 LAB — CMP14+EGFR
ALT: 13 IU/L (ref 0–32)
AST: 15 IU/L (ref 0–40)
Albumin/Globulin Ratio: 1.8 (ref 1.2–2.2)
Albumin: 4.4 g/dL (ref 3.9–5.0)
Alkaline Phosphatase: 121 IU/L (ref 48–121)
BUN/Creatinine Ratio: 16 (ref 9–23)
BUN: 14 mg/dL (ref 6–20)
Bilirubin Total: 0.2 mg/dL (ref 0.0–1.2)
CO2: 21 mmol/L (ref 20–29)
Calcium: 9.5 mg/dL (ref 8.7–10.2)
Chloride: 104 mmol/L (ref 96–106)
Creatinine, Ser: 0.87 mg/dL (ref 0.57–1.00)
GFR calc Af Amer: 107 mL/min/{1.73_m2} (ref >59)
GFR calc non Af Amer: 93 mL/min/{1.73_m2} (ref >59)
Globulin, Total: 2.5 g/dL (ref 1.5–4.5)
Glucose: 80 mg/dL (ref 65–99)
Potassium: 4.4 mmol/L (ref 3.5–5.2)
Sodium: 138 mmol/L (ref 134–144)
Total Protein: 6.9 g/dL (ref 6.0–8.5)

## 2020-04-15 LAB — CBC WITH DIFFERENTIAL/PLATELET
Basophils Absolute: 0.1 10*3/uL (ref 0.0–0.2)
Basos: 1 %
EOS (ABSOLUTE): 0.1 10*3/uL (ref 0.0–0.4)
Eos: 0 %
Hematocrit: 43.2 % (ref 34.0–46.6)
Hemoglobin: 14.7 g/dL (ref 11.1–15.9)
Immature Grans (Abs): 0 10*3/uL (ref 0.0–0.1)
Immature Granulocytes: 0 %
Lymphocytes Absolute: 3 10*3/uL (ref 0.7–3.1)
Lymphs: 26 %
MCH: 30.2 pg (ref 26.6–33.0)
MCHC: 34 g/dL (ref 31.5–35.7)
MCV: 89 fL (ref 79–97)
Monocytes Absolute: 0.8 10*3/uL (ref 0.1–0.9)
Monocytes: 7 %
Neutrophils Absolute: 7.6 10*3/uL — ABNORMAL HIGH (ref 1.4–7.0)
Neutrophils: 66 %
Platelets: 291 10*3/uL (ref 150–450)
RBC: 4.87 x10E6/uL (ref 3.77–5.28)
RDW: 12 % (ref 11.7–15.4)
WBC: 11.6 10*3/uL — ABNORMAL HIGH (ref 3.4–10.8)

## 2020-04-15 LAB — TSH: TSH: 1.22 u[IU]/mL (ref 0.450–4.500)

## 2020-04-25 ENCOUNTER — Telehealth: Payer: Self-pay

## 2020-04-25 ENCOUNTER — Other Ambulatory Visit: Payer: Self-pay

## 2020-04-25 DIAGNOSIS — Z20822 Contact with and (suspected) exposure to covid-19: Secondary | ICD-10-CM

## 2020-04-25 MED ORDER — ALBUTEROL SULFATE HFA 108 (90 BASE) MCG/ACT IN AERS: 2.0000 | INHALATION_SPRAY | Freq: Four times a day (QID) | RESPIRATORY_TRACT | 1 refills | Status: DC | PRN

## 2020-04-25 NOTE — Telephone Encounter (Signed)
Patient notified of results she stated the dizziness is much better and she has an appointment with eye doctor next week. YL,RMA   White count was slightly up which can occur with cold symptoms, kidney and liver functions are normal. How is your dizziness and have you been to the eye doctor? Please give [place] a call or send message back regarding answer to question.

## 2020-07-21 ENCOUNTER — Encounter: Payer: BLUE CROSS/BLUE SHIELD | Admitting: Nurse Practitioner

## 2020-08-17 ENCOUNTER — Ambulatory Visit: Payer: BC Managed Care – PPO | Admitting: Obstetrics and Gynecology

## 2020-08-29 ENCOUNTER — Ambulatory Visit: Payer: Self-pay | Admitting: Advanced Practice Midwife

## 2020-08-29 NOTE — Progress Notes (Deleted)
  GYNECOLOGY PROGRESS NOTE  History:  24 y.o. G0P0000 presents to [facility] *** office today for problem gyn visit. She reports *****.  She denies h/a, dizziness, shortness of breath, n/v, or fever/chills.    The following portions of the patient's history were reviewed and updated as appropriate: allergies, current medications, past family history, past medical history, past social history, past surgical history and problem list. Last pap smear on *** was normal, *** HRHPV.  Review of Systems:  Pertinent items are noted in HPI.   Objective:  Physical Exam There were no vitals taken for this visit. VS reviewed, nursing note reviewed,  Constitutional: well developed, well nourished, no distress HEENT: normocephalic CV: normal rate Pulm/chest wall: normal effort Breast Exam: deferred Abdomen: soft Neuro: alert and oriented x 3 Skin: warm, dry Psych: affect normal Pelvic exam: Cervix pink, visually closed, without lesion, scant white creamy discharge, vaginal walls and external genitalia normal Bimanual exam: Cervix 0/long/high, firm, anterior, neg CMT, uterus nontender, nonenlarged, adnexa without tenderness, enlargement, or mass  Assessment & Plan:  1. Encounter for counseling regarding contraception ***  Sharen Counter, CNM 8:32 AM

## 2022-07-23 ENCOUNTER — Encounter: Payer: Self-pay | Admitting: Radiology

## 2022-09-26 ENCOUNTER — Ambulatory Visit: Payer: Self-pay | Admitting: Nurse Practitioner

## 2022-12-13 DIAGNOSIS — Z419 Encounter for procedure for purposes other than remedying health state, unspecified: Secondary | ICD-10-CM | POA: Diagnosis not present

## 2023-01-11 DIAGNOSIS — Z419 Encounter for procedure for purposes other than remedying health state, unspecified: Secondary | ICD-10-CM | POA: Diagnosis not present

## 2023-01-23 ENCOUNTER — Encounter: Payer: Medicaid Other | Admitting: Radiology

## 2023-02-11 DIAGNOSIS — Z419 Encounter for procedure for purposes other than remedying health state, unspecified: Secondary | ICD-10-CM | POA: Diagnosis not present

## 2023-02-12 ENCOUNTER — Ambulatory Visit: Payer: Medicaid Other | Admitting: Nurse Practitioner

## 2023-03-13 DIAGNOSIS — Z419 Encounter for procedure for purposes other than remedying health state, unspecified: Secondary | ICD-10-CM | POA: Diagnosis not present

## 2023-04-09 ENCOUNTER — Ambulatory Visit: Payer: Medicaid Other | Admitting: Nurse Practitioner

## 2023-04-09 NOTE — Progress Notes (Deleted)
  Hershal Coria Acelin Ferdig,acting as a Neurosurgeon for Arnette Felts, FNP.,have documented all relevant documentation on the behalf of Arnette Felts, FNP,as directed by  Arnette Felts, FNP while in the presence of Arnette Felts, FNP.    Subjective:     Patient ID: Melissa Chang , female    DOB: 30-Apr-1996 , 27 y.o.   MRN: [id number]   No chief complaint on file.   HPI  Patient present today to establish care, patient would like to discuss     Past Medical History:  Diagnosis Date  . Depression   . Diabetes mellitus without complication ([facility])   . Headache(784.0)   . Migraine   . Obesity   . Papilledema    Bilateral  . Prediabetes      Family History  Problem Relation Age of Onset  . Diabetes Mother   . Diabetes Sister   . Diabetes Maternal Aunt   . Hypertension Maternal Grandmother   . Diabetes Maternal Grandmother      Current Outpatient Medications:  .  albuterol (VENTOLIN HFA) 108 (90 Base) MCG/ACT inhaler, Inhale 2 puffs into the lungs every 6 (six) hours as needed for wheezing or shortness of breath., Disp: 18 g, Rfl: 1   No Known Allergies   Review of Systems   There were no vitals filed for this visit. There is no height or weight on file to calculate BMI.  The ASCVD Risk score (Arnett DK, et al., 2019) failed to calculate for the following reasons:   The 2019 ASCVD risk score is only valid for ages 42 to 55 ++ Objective:  Physical Exam      Assessment And Plan:     1. Establishing care with new doctor, encounter for  2. PCO (polycystic ovaries)  3. Adjustment disorder with anxiety  4. Memory deficit    No follow-ups on file.  Patient was given opportunity to ask questions. Patient verbalized understanding of the plan and was able to repeat key elements of the plan. All questions were answered to their satisfaction.  Marlyn Corporal, CMA   I, Marlyn Corporal, CMA, have reviewed all documentation for this visit. The documentation on 04/09/23 for the exam,  diagnosis, procedures, and orders are all accurate and complete.   IF YOU HAVE BEEN REFERRED TO A SPECIALIST, IT MAY TAKE 1-2 WEEKS TO SCHEDULE/PROCESS THE REFERRAL. IF YOU HAVE NOT HEARD FROM US/SPECIALIST IN TWO WEEKS, PLEASE GIVE [place] A CALL AT [phone] X [phone].   THE PATIENT IS ENCOURAGED TO PRACTICE SOCIAL DISTANCING DUE TO THE COVID-19 PANDEMIC.

## 2023-04-13 DIAGNOSIS — Z419 Encounter for procedure for purposes other than remedying health state, unspecified: Secondary | ICD-10-CM | POA: Diagnosis not present

## 2023-04-30 ENCOUNTER — Ambulatory Visit: Payer: Medicaid Other | Admitting: Nurse Practitioner

## 2023-05-13 DIAGNOSIS — Z419 Encounter for procedure for purposes other than remedying health state, unspecified: Secondary | ICD-10-CM | POA: Diagnosis not present

## 2023-06-13 DIAGNOSIS — Z419 Encounter for procedure for purposes other than remedying health state, unspecified: Secondary | ICD-10-CM | POA: Diagnosis not present

## 2023-07-14 DIAGNOSIS — Z419 Encounter for procedure for purposes other than remedying health state, unspecified: Secondary | ICD-10-CM | POA: Diagnosis not present

## 2023-08-13 DIAGNOSIS — Z419 Encounter for procedure for purposes other than remedying health state, unspecified: Secondary | ICD-10-CM | POA: Diagnosis not present

## 2023-09-13 DIAGNOSIS — Z419 Encounter for procedure for purposes other than remedying health state, unspecified: Secondary | ICD-10-CM | POA: Diagnosis not present

## 2023-10-13 DIAGNOSIS — Z419 Encounter for procedure for purposes other than remedying health state, unspecified: Secondary | ICD-10-CM | POA: Diagnosis not present

## 2023-10-16 ENCOUNTER — Ambulatory Visit (INDEPENDENT_AMBULATORY_CARE_PROVIDER_SITE_OTHER): Payer: Self-pay | Admitting: Family Medicine

## 2023-10-16 DIAGNOSIS — Z0000 Encounter for general adult medical examination without abnormal findings: Secondary | ICD-10-CM

## 2023-10-17 NOTE — Progress Notes (Signed)
No show for new patient appointment 10/16/23

## 2023-10-25 DIAGNOSIS — Z113 Encounter for screening for infections with a predominantly sexual mode of transmission: Secondary | ICD-10-CM | POA: Diagnosis not present

## 2023-10-25 DIAGNOSIS — N898 Other specified noninflammatory disorders of vagina: Secondary | ICD-10-CM | POA: Diagnosis not present

## 2023-10-25 DIAGNOSIS — N760 Acute vaginitis: Secondary | ICD-10-CM | POA: Diagnosis not present

## 2023-10-28 ENCOUNTER — Ambulatory Visit: Payer: Medicaid Other | Admitting: Urgent Care

## 2023-11-13 DIAGNOSIS — Z419 Encounter for procedure for purposes other than remedying health state, unspecified: Secondary | ICD-10-CM | POA: Diagnosis not present

## 2023-11-27 ENCOUNTER — Ambulatory Visit: Payer: Medicaid Other | Admitting: Nurse Practitioner

## 2023-12-14 DIAGNOSIS — Z419 Encounter for procedure for purposes other than remedying health state, unspecified: Secondary | ICD-10-CM | POA: Diagnosis not present

## 2023-12-26 ENCOUNTER — Encounter (HOSPITAL_BASED_OUTPATIENT_CLINIC_OR_DEPARTMENT_OTHER): Payer: Self-pay | Admitting: Emergency Medicine

## 2023-12-26 ENCOUNTER — Other Ambulatory Visit: Payer: Self-pay

## 2023-12-26 DIAGNOSIS — R519 Headache, unspecified: Secondary | ICD-10-CM | POA: Insufficient documentation

## 2023-12-26 DIAGNOSIS — Z5321 Procedure and treatment not carried out due to patient leaving prior to being seen by health care provider: Secondary | ICD-10-CM | POA: Insufficient documentation

## 2023-12-26 LAB — RESP PANEL BY RT-PCR (RSV, FLU A&B, COVID)  RVPGX2
Influenza A by PCR: NEGATIVE
Influenza B by PCR: NEGATIVE
Resp Syncytial Virus by PCR: NEGATIVE
SARS Coronavirus 2 by RT PCR: NEGATIVE

## 2023-12-26 NOTE — ED Triage Notes (Signed)
Pt via pov from home with headache, emesis, body aches, chills today. Pt also reports dry coughing today. Pt alert  & oriented, nad noted.

## 2023-12-27 ENCOUNTER — Emergency Department (HOSPITAL_BASED_OUTPATIENT_CLINIC_OR_DEPARTMENT_OTHER)
Admission: EM | Admit: 2023-12-27 | Discharge: 2023-12-27 | Discharge status: Against Medical Advice | Payer: Medicaid Other | Attending: Emergency Medicine | Admitting: Emergency Medicine

## 2023-12-27 NOTE — ED Notes (Signed)
Called x1 for vs and reassessment in triage no answer

## 2023-12-27 NOTE — ED Notes (Signed)
Called x3 for vs and reassessment in triage and no answer

## 2024-01-07 ENCOUNTER — Encounter: Payer: Self-pay | Admitting: Obstetrics & Gynecology

## 2024-01-07 ENCOUNTER — Ambulatory Visit (INDEPENDENT_AMBULATORY_CARE_PROVIDER_SITE_OTHER): Payer: Medicaid Other | Admitting: Obstetrics & Gynecology

## 2024-01-07 ENCOUNTER — Other Ambulatory Visit (HOSPITAL_COMMUNITY)
Admission: RE | Admit: 2024-01-07 | Discharge: 2024-01-07 | Disposition: A | Discharge status: Home / Self Care | Payer: Medicaid Other | Source: Ambulatory Visit | Attending: Obstetrics & Gynecology | Admitting: Obstetrics & Gynecology

## 2024-01-07 VITALS — BP 126/87 | HR 70 | Ht 64.0 in | Wt 267.0 lb

## 2024-01-07 DIAGNOSIS — Z3202 Encounter for pregnancy test, result negative: Secondary | ICD-10-CM

## 2024-01-07 DIAGNOSIS — L83 Acanthosis nigricans: Secondary | ICD-10-CM

## 2024-01-07 DIAGNOSIS — Z113 Encounter for screening for infections with a predominantly sexual mode of transmission: Secondary | ICD-10-CM | POA: Diagnosis not present

## 2024-01-07 DIAGNOSIS — Z01419 Encounter for gynecological examination (general) (routine) without abnormal findings: Secondary | ICD-10-CM | POA: Diagnosis not present

## 2024-01-07 DIAGNOSIS — Z1339 Encounter for screening examination for other mental health and behavioral disorders: Secondary | ICD-10-CM

## 2024-01-07 NOTE — Progress Notes (Signed)
 GYNECOLOGY ANNUAL PHYSICAL EXAM PROGRESS NOTE  Subjective:    Melissa Chang is a 28 y.o. single G0P0000 who presents for an annual exam. She has not been here for about 6 years.  The patient is sexually active. The patient participates in regular exercise: yes. ( Cardio) Has the patient ever been transfused or tattooed?: yes. The patient reports that there is not domestic violence in her life.   The patient has the following complaints today: Would like STI screening   Menstrual History: Menarche age: 8 Patient's last menstrual period was 10/24/2023 (exact date).     Gynecologic History:  Contraception: condoms History of STI's: trich in 2020 Last Pap: 2024. Results were: normal.  Denies h/o abnormal pap smears.   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    OB History  Gravida Para Term Preterm AB Living  0 0 0 0 0 0  SAB IAB Ectopic Multiple Live Births  0 0 0 0 0    Past Medical History:  Diagnosis Date   Depression    Diabetes mellitus without complication (HCC)    Headache(784.0)    Migraine    Obesity    Papilledema    Bilateral   Prediabetes     History reviewed. No pertinent surgical history.  Family History  Problem Relation Age of Onset   Diabetes Mother    Diabetes Sister    Diabetes Maternal Aunt    Hypertension Maternal Grandmother    Diabetes Maternal Grandmother     Social History   Socioeconomic History   Marital status: Single    Spouse name: Not on file   Number of children: 0   Years of education: 12   Highest education level: Not on file  Occupational History   Occupation: Teaching laboratory technician: [employer]  Tobacco Use   Smoking status: Never   Smokeless tobacco: Never  Vaping Use   Vaping status: Never Used  Substance and Sexual Activity   Alcohol use: Yes    Alcohol/week: 0.0 standard drinks of alcohol    Comment: socially   Drug use: No   Sexual activity: Yes     Partners: Male    Birth control/protection: Condom  Other Topics Concern   Not on file  Social History Narrative   Fun: Sleep, going to the beach   Denies religious beliefs effecting health care.   Denies abuse and feels safe at home.    Social Drivers of Corporate investment banker Strain: Not on file  Food Insecurity: Not on file  Transportation Needs: Not on file  Physical Activity: Not on file  Stress: Not on file  Social Connections: Unknown (03/26/2022)   Received from [facility], [facility]   Social Network    Social Network: Not on file  Intimate Partner Violence: Unknown (02/15/2022)   [facility] from [facility], [facility]   HITS    Physically Hurt: Not on file    Insult or Talk Down To: Not on file    Threaten Physical Harm: Not on file    Scream or Curse: Not on file    Current Outpatient Medications on File Prior to Visit  Medication Sig Dispense Refill   albuterol (VENTOLIN HFA) 108 (90 Base) MCG/ACT inhaler Inhale 2 puffs into the lungs every 6 (six) hours as needed for wheezing or shortness of breath. 18 g 1   No current facility-administered medications on file prior to visit.  No Known Allergies   Review of Systems Constitutional: negative for chills, fatigue, fevers and sweats Eyes: negative for irritation, redness and visual disturbance Ears, nose, mouth, throat, and face: negative for hearing loss, nasal congestion, snoring and tinnitus Respiratory: negative for asthma, cough, sputum Cardiovascular: negative for chest pain, dyspnea, exertional chest pressure/discomfort, irregular heart beat, palpitations and syncope Gastrointestinal: negative for abdominal pain, change in bowel habits, nausea and vomiting Genitourinary: negative for abnormal menstrual periods, genital lesions, sexual problems and vaginal discharge, dysuria and urinary incontinence Integument/breast: negative for breast lump, breast tenderness and nipple  discharge Hematologic/lymphatic: negative for bleeding and easy bruising Musculoskeletal:negative for back pain and muscle weakness Neurological: negative for dizziness, headaches, vertigo and weakness Endocrine: negative for diabetic symptoms including polydipsia, polyuria and skin dryness Allergic/Immunologic: negative for hay fever and urticaria      Objective:  Blood pressure 126/87, pulse 70, height 5\' 4"  (1.626 m), weight 267 lb (121.1 kg), last menstrual period 10/24/2023. Body mass index is 45.83 kg/m.    General Appearance:    Alert, cooperative, no distress, appears stated age  Head:    Normocephalic, without obvious abnormality, atraumatic  Eyes:    PERRL, conjunctiva/corneas clear, EOM's intact, both eyes  Ears:    Normal external ear canals, both ears  Nose:   Nares normal, septum midline, mucosa normal, no drainage or sinus tenderness  Throat:   Lips, mucosa, and tongue normal; teeth and gums normal  Neck:   Supple, symmetrical, trachea midline, no adenopathy; thyroid: no enlargement/tenderness/nodules; no carotid bruit or JVD  Back:     Symmetric, no curvature, ROM normal, no CVA tenderness  Lungs:     Clear to auscultation bilaterally, respirations unlabored  Chest Wall:    No tenderness or deformity   Heart:    Regular rate and rhythm, S1 and S2 normal, no murmur, rub or gallop  Breast Exam:    No tenderness, masses, or nipple abnormality  Abdomen:     Soft, non-tender, bowel sounds active all four quadrants, no masses, no organomegaly.    Genitalia:    Pelvic:external genitalia normal, acanthosis nigricans note, vagina without lesions, discharge, or tenderness, rectovaginal septum  normal. Cervix normal in appearance, no cervical motion tenderness, no adnexal masses or tenderness.  Uterus normal size, shape, mobile, regular contours, nontender.  Rectal:    Normal external sphincter.  No hemorrhoids appreciated. Internal exam not done.   Extremities:   Extremities normal,  atraumatic, no cyanosis or edema  Pulses:   2+ and symmetric all extremities  Skin:   Skin color, texture, turgor normal, no rashes or lesions  Lymph nodes:   Cervical, supraclavicular, and axillary nodes normal  Neurologic:   CNII-XII intact, normal strength, sensation and reflexes throughout   .  Labs:  Lab Results  Component Value Date   WBC 11.6 (H) 04/14/2020   HGB 14.7 04/14/2020   HCT 43.2 04/14/2020   MCV 89 04/14/2020   PLT 291 04/14/2020    Lab Results  Component Value Date   CREATININE 0.87 04/14/2020   BUN 14 04/14/2020   NA 138 04/14/2020   K 4.4 04/14/2020   CL 104 04/14/2020   CO2 21 04/14/2020    Lab Results  Component Value Date   ALT 13 04/14/2020   AST 15 04/14/2020   ALKPHOS 121 04/14/2020   BILITOT 0.2 04/14/2020    Lab Results  Component Value Date   TSH 1.220 04/14/2020     Assessment:   1. Women's annual  routine gynecological examination [Z01.419]   2. Routine screening for STI (sexually transmitted infection)      Plan:  Blood tests: STI screening including HSV per her request. Breast self exam technique reviewed and patient encouraged to perform self-exam monthly. Contraception: condoms. Discussed healthy lifestyle modifications. Pap smear  due in 2027 . She has a fam med appt this week for fasting labs Acanthosis nigricans- check A1C Follow up in 1 year for annual exam    Allie Bossier, MD [facility]

## 2024-01-07 NOTE — Progress Notes (Signed)
 28 y.o. New GYN presents for AEX/PAP/STD screening.

## 2024-01-08 ENCOUNTER — Encounter: Payer: Self-pay | Admitting: Obstetrics & Gynecology

## 2024-01-08 ENCOUNTER — Other Ambulatory Visit: Payer: Self-pay | Admitting: Obstetrics & Gynecology

## 2024-01-08 DIAGNOSIS — B9689 Other specified bacterial agents as the cause of diseases classified elsewhere: Secondary | ICD-10-CM

## 2024-01-08 LAB — CERVICOVAGINAL ANCILLARY ONLY
Bacterial Vaginitis (gardnerella): POSITIVE — AB
Candida Glabrata: NEGATIVE
Candida Vaginitis: NEGATIVE
Chlamydia: NEGATIVE
Comment: NEGATIVE
Comment: NEGATIVE
Comment: NEGATIVE
Comment: NEGATIVE
Comment: NEGATIVE
Comment: NORMAL
Neisseria Gonorrhea: NEGATIVE
Trichomonas: NEGATIVE

## 2024-01-08 LAB — HEMOGLOBIN A1C
Est. average glucose Bld gHb Est-mCnc: 123 mg/dL
Hgb A1c MFr Bld: 5.9 % — ABNORMAL HIGH (ref 4.8–5.6)

## 2024-01-08 MED ORDER — METRONIDAZOLE 500 MG PO TABS: 500.0000 mg | ORAL_TABLET | Freq: Two times a day (BID) | ORAL | 0 refills | Status: DC

## 2024-01-08 NOTE — Progress Notes (Signed)
 Flagyl prescribed for bv Mychart message sent

## 2024-01-09 LAB — HEPATITIS C ANTIBODY: Hep C Virus Ab: NONREACTIVE

## 2024-01-09 LAB — HSV 1 AND 2 AB, IGG
HSV 1 Glycoprotein G Ab, IgG: REACTIVE — AB
HSV 2 IgG, Type Spec: REACTIVE — AB

## 2024-01-09 LAB — HIV ANTIBODY (ROUTINE TESTING W REFLEX): HIV Screen 4th Generation wRfx: NONREACTIVE

## 2024-01-09 LAB — POCT URINE PREGNANCY: Preg Test, Ur: NEGATIVE

## 2024-01-09 LAB — RPR: RPR Ser Ql: NONREACTIVE

## 2024-01-09 LAB — HEPATITIS B SURFACE ANTIGEN: Hepatitis B Surface Ag: NEGATIVE

## 2024-01-10 ENCOUNTER — Other Ambulatory Visit: Payer: Self-pay | Admitting: Family Medicine

## 2024-01-10 ENCOUNTER — Encounter: Payer: Self-pay | Admitting: Family Medicine

## 2024-01-10 ENCOUNTER — Ambulatory Visit: Payer: Medicaid Other | Admitting: Family Medicine

## 2024-01-10 DIAGNOSIS — T383X5A Adverse effect of insulin and oral hypoglycemic [antidiabetic] drugs, initial encounter: Secondary | ICD-10-CM | POA: Diagnosis not present

## 2024-01-10 DIAGNOSIS — R7303 Prediabetes: Secondary | ICD-10-CM | POA: Diagnosis not present

## 2024-01-10 DIAGNOSIS — E282 Polycystic ovarian syndrome: Secondary | ICD-10-CM | POA: Diagnosis not present

## 2024-01-10 DIAGNOSIS — E559 Vitamin D deficiency, unspecified: Secondary | ICD-10-CM

## 2024-01-10 DIAGNOSIS — R5383 Other fatigue: Secondary | ICD-10-CM

## 2024-01-10 DIAGNOSIS — Z6841 Body Mass Index (BMI) 40.0 and over, adult: Secondary | ICD-10-CM | POA: Diagnosis not present

## 2024-01-10 DIAGNOSIS — E7800 Pure hypercholesterolemia, unspecified: Secondary | ICD-10-CM

## 2024-01-10 LAB — COMPREHENSIVE METABOLIC PANEL
ALT: 17 U/L (ref 0–35)
AST: 15 U/L (ref 0–37)
Albumin: 4.2 g/dL (ref 3.5–5.2)
Alkaline Phosphatase: 109 U/L (ref 39–117)
BUN: 10 mg/dL (ref 6–23)
CO2: 25 meq/L (ref 19–32)
Calcium: 9.4 mg/dL (ref 8.4–10.5)
Chloride: 106 meq/L (ref 96–112)
Creatinine, Ser: 1.04 mg/dL (ref 0.40–1.20)
GFR: 72.67 mL/min (ref >60.00)
Glucose, Bld: 98 mg/dL (ref 70–99)
Potassium: 4.7 meq/L (ref 3.5–5.1)
Sodium: 139 meq/L (ref 135–145)
Total Bilirubin: 0.5 mg/dL (ref 0.2–1.2)
Total Protein: 7.2 g/dL (ref 6.0–8.3)

## 2024-01-10 LAB — CBC WITH DIFFERENTIAL/PLATELET
Basophils Absolute: 0.1 10*3/uL (ref 0.0–0.1)
Basophils Relative: 0.5 % (ref 0.0–3.0)
Eosinophils Absolute: 0.1 10*3/uL (ref 0.0–0.7)
Eosinophils Relative: 0.6 % (ref 0.0–5.0)
HCT: 44.1 % (ref 36.0–46.0)
Hemoglobin: 14.5 g/dL (ref 12.0–15.0)
Lymphocytes Relative: 25.8 % (ref 12.0–46.0)
Lymphs Abs: 2.6 10*3/uL (ref 0.7–4.0)
MCHC: 32.9 g/dL (ref 30.0–36.0)
MCV: 89.3 fl (ref 78.0–100.0)
Monocytes Absolute: 0.7 10*3/uL (ref 0.1–1.0)
Monocytes Relative: 6.8 % (ref 3.0–12.0)
Neutro Abs: 6.8 10*3/uL (ref 1.4–7.7)
Neutrophils Relative %: 66.3 % (ref 43.0–77.0)
Platelets: 278 10*3/uL (ref 150.0–400.0)
RBC: 4.94 Mil/uL (ref 3.87–5.11)
RDW: 12.9 % (ref 11.5–15.5)
WBC: 10.2 10*3/uL (ref 4.0–10.5)

## 2024-01-10 LAB — VITAMIN B12: Vitamin B-12: 421 pg/mL (ref 211–911)

## 2024-01-10 LAB — LIPID PANEL
Cholesterol: 183 mg/dL (ref 0–200)
HDL: 41.1 mg/dL (ref >39.00)
LDL Cholesterol: 119 mg/dL — ABNORMAL HIGH (ref 0–99)
NonHDL: 141.95
Total CHOL/HDL Ratio: 4
Triglycerides: 114 mg/dL (ref 0.0–149.0)
VLDL: 22.8 mg/dL (ref 0.0–40.0)

## 2024-01-10 LAB — FOLATE: Folate: 14.7 ng/mL (ref >5.9)

## 2024-01-10 LAB — VITAMIN D 25 HYDROXY (VIT D DEFICIENCY, FRACTURES): VITD: 8.75 ng/mL — ABNORMAL LOW (ref 30.00–100.00)

## 2024-01-10 LAB — T4, FREE: Free T4: 0.91 ng/dL (ref 0.60–1.60)

## 2024-01-10 LAB — FERRITIN: Ferritin: 39.6 ng/mL (ref 10.0–291.0)

## 2024-01-10 LAB — TSH: TSH: 1 u[IU]/mL (ref 0.35–5.50)

## 2024-01-10 MED ORDER — VITAMIN D (ERGOCALCIFEROL) 1.25 MG (50000 UNIT) PO CAPS: 50000.0000 [IU] | ORAL_CAPSULE | ORAL | 2 refills | Status: DC

## 2024-01-10 NOTE — Progress Notes (Addendum)
 New Patient Office Visit  Subjective    Patient ID: Melissa Chang, female    DOB: [birth year]  Age: 28 y.o. MRN: [id number]  CC:  Chief Complaint  Patient presents with   Establish Care    Wants some bloodwork and discuss glp1    HPI Melissa Chang presents to establish care Previous PCP 1 year ago.   OB/GYN- Dr. [facility]. She was there this week for pap smear   Prediabetes states her A1c 5.9% done at OB/GYN office this week.  Family hx of DM   Cold sores 2 to 3/year  Metformin in the past caused her diarrhea. She took it for several months. Stopped it 5 years ago.   She would like to start on a GLP-1 for weight loss She has struggled with her weight for years and has tried dieting off and on.   She is not currently on birth control.  States she is not currently sexually active.  Complains of fatigue.  States she sleeps during the day sometimes  Lives alone.  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   [DISCONTINUED] albuterol (VENTOLIN HFA) 108 (90 Base) MCG/ACT inhaler Inhale 2 puffs into the lungs every 6 (six) hours as needed for wheezing or shortness of breath.   No facility-administered encounter medications on file as of 01/10/2024.    Past Medical History:  Diagnosis Date   Anemia    Depression    Diabetes mellitus without complication (HCC)    Headache(784.0)    Migraine    Obesity    Papilledema    Bilateral   Prediabetes     History reviewed. No pertinent surgical history.  Family History  Problem Relation Age of Onset   Diabetes Mother    Diabetes Sister    Diabetes Maternal Aunt    Hypertension Maternal Grandmother    Diabetes Maternal Grandmother     Social History   Socioeconomic History   Marital status: Single    Spouse name: Not on file    Number of children: 0   Years of education: 12   Highest education level: 12th grade  Occupational History   Occupation: Teaching laboratory technician: [employer]  Tobacco Use   Smoking status: Never   Smokeless tobacco: Never  Vaping Use   Vaping status: Never Used  Substance and Sexual Activity   Alcohol use: Yes    Comment: socially   Drug use: No   Sexual activity: Yes    Partners: Male    Birth control/protection: Condom  Other Topics Concern   Not on file  Social History Narrative   Fun: Sleep, going to the beach   Denies religious beliefs effecting health care.   Denies abuse and feels safe at home.    Social Drivers of Corporate investment banker Strain: Low Risk  (01/09/2024)   Overall Financial Resource Strain (CARDIA)    Difficulty of Paying Living Expenses: Not hard at all  Food Insecurity: No Food Insecurity (01/09/2024)   Hunger Vital Sign    Worried About Running Out of Food in the Last Year: Never true    Ran Out of Food in the Last Year: Never true  Transportation Needs: No Transportation Needs (01/09/2024)   PRAPARE - Transportation  Lack of Transportation (Medical): No    Lack of Transportation (Non-Medical): No  Physical Activity: Insufficiently Active (01/09/2024)   Exercise Vital Sign    Days of Exercise per Week: 2 days    Minutes of Exercise per Session: 40 min  Stress: No Stress Concern Present (01/09/2024)   [employer] of Occupational Health - Occupational Stress Questionnaire    Feeling of Stress : Not at all  Social Connections: Moderately Isolated (01/09/2024)   Social Connection and Isolation Panel [NHANES]    Frequency of Communication with Friends and Family: Three times a week    Frequency of Social Gatherings with Friends and Family: Once a week    Attends Religious Services: 1 to 4 times per year    Active Member of [employer] or Organizations: No    Attends Engineer, structural: Not on file    Marital Status:  Separated  Intimate Partner Violence: Unknown (02/15/2022)   Received from [employer], [facility]   HITS    Physically Hurt: Not on file    Insult or Talk Down To: Not on file    Threaten Physical Harm: Not on file    Scream or Curse: Not on file    Review of Systems  Constitutional:  Positive for malaise/fatigue. Negative for chills, fever and weight loss.  Eyes:  Negative for blurred vision and double vision.  Respiratory:  Negative for cough and shortness of breath.   Cardiovascular:  Positive for leg swelling. Negative for chest pain and palpitations.       Intermittent dependent edema  Gastrointestinal:  Negative for abdominal pain, constipation, diarrhea, nausea and vomiting.  Genitourinary:  Negative for dysuria, frequency and urgency.  Musculoskeletal:  Negative for falls, joint pain and myalgias.  Neurological:  Negative for dizziness, focal weakness and headaches.  Psychiatric/Behavioral:  Negative for depression, substance abuse and suicidal ideas. The patient is not nervous/anxious.         Objective    BP 120/68 (BP Location: Left Arm, Patient Position: Sitting)   Pulse 84   Temp 97.6 F (36.4 C) (Temporal)   Ht 5\' 4"  (1.626 m)   Wt 268 lb (121.6 kg)   LMP 10/24/2023 (Exact Date)   SpO2 97%   BMI 46.00 kg/m   Physical Exam Constitutional:      General: She is not in acute distress.    Appearance: She is obese. She is not ill-appearing.  Eyes:     Extraocular Movements: Extraocular movements intact.     Conjunctiva/sclera: Conjunctivae normal.  Neck:     Thyroid: No thyroid tenderness.  Cardiovascular:     Rate and Rhythm: Normal rate and regular rhythm.  Pulmonary:     Effort: Pulmonary effort is normal.     Breath sounds: Normal breath sounds.  Musculoskeletal:     Cervical back: Normal range of motion and neck supple. No tenderness.     Right lower leg: No edema.     Left lower leg: No edema.  Lymphadenopathy:     Cervical: No cervical  adenopathy.  Skin:    General: Skin is warm and dry.  Neurological:     General: No focal deficit present.     Mental Status: She is alert and oriented to person, place, and time.     Motor: No weakness.     Coordination: Coordination normal.     Gait: Gait normal.  Psychiatric:        Mood and Affect: Mood normal.  Behavior: Behavior normal.        Thought Content: Thought content normal.         Assessment & Plan:   Problem List Items Addressed This Visit     PCO (polycystic ovaries)   Other Visit Diagnoses       Severe obesity (BMI >= 40) (HCC)    -  Primary   Relevant Orders   CBC with Differential/Platelet   Comprehensive metabolic panel   TSH   T4, free   Lipid panel     Prediabetes       Relevant Orders   CBC with Differential/Platelet   Comprehensive metabolic panel     Fatigue, unspecified type       Relevant Orders   CBC with Differential/Platelet   Comprehensive metabolic panel   TSH   T4, free   Vitamin B12   VITAMIN D 25 Hydroxy (Vit-D Deficiency, Fractures)   Ferritin   Folate     Adverse effect of metformin, initial encounter          She is a pleasant 28 year old female who is new to the practice and here to establish care. Prediabetes and PCOS.  She sees her OB/GYN and was last there this week.  A1c 5.9%. In-depth counseling on healthy diet and increasing physical activity. She took metformin in the past but had diarrhea and did not tolerate it. We discussed a healthy, low fat, low carbohydrate diet and she is willing to work on this. I also recommend increasing activity which will be easier for her after weight loss.  Discussed getting on a reliable birth control in order to start GLP-1.  It appears that Zepbound may be covered under her insurance but it would need to be sent to the pharmacy to find out. Check labs to look for underlying etiology for fatigue.  Check labs to look for palpitations related to obesity. Follow-up in 3  months or sooner if needed.  Return in about 3 months (around 04/08/2024).   Hetty Blend, NP-C

## 2024-01-10 NOTE — Patient Instructions (Signed)
 Please go downstairs for labs before you leave.  Reach out to your OB/GYN regarding contraception and Zepbound (once weekly injection).   It looks like the medication would be covered under your insurance but we would have to send it to your pharmacy to find out for sure.  I will be in touch with your results.

## 2024-01-11 DIAGNOSIS — Z419 Encounter for procedure for purposes other than remedying health state, unspecified: Secondary | ICD-10-CM | POA: Diagnosis not present

## 2024-01-13 ENCOUNTER — Encounter: Payer: Self-pay | Admitting: Obstetrics & Gynecology

## 2024-01-20 ENCOUNTER — Ambulatory Visit

## 2024-01-20 VITALS — BP 130/76 | HR 82

## 2024-01-20 DIAGNOSIS — Z3042 Encounter for surveillance of injectable contraceptive: Secondary | ICD-10-CM | POA: Diagnosis not present

## 2024-01-20 DIAGNOSIS — Z3202 Encounter for pregnancy test, result negative: Secondary | ICD-10-CM | POA: Diagnosis not present

## 2024-01-20 LAB — POCT URINE PREGNANCY: Preg Test, Ur: NEGATIVE

## 2024-01-20 MED ORDER — MEDROXYPROGESTERONE ACETATE 150 MG/ML IM SUSP
150.0000 mg | Freq: Once | INTRAMUSCULAR | Status: AC
  Administered 2024-01-20: 150 mg via INTRAMUSCULAR

## 2024-01-20 MED ORDER — MEDROXYPROGESTERONE ACETATE 150 MG/ML IM SUSP: 150.0000 mg | INTRAMUSCULAR | 3 refills | Status: AC

## 2024-01-20 NOTE — Progress Notes (Signed)
 Date last pap: 01/07/24. Last Depo-Provera: Pt here for depo restart. Did first UPT on 2/25 which was negative. No unprotected intercourse since. 2nd UPT today negative, okay to start depo per protocol. Side Effects if any: NA. Serum HCG indicated? NA. Depo-Provera 150 mg IM given by: Karma Ganja, RN. Next appointment due May 26th - June 9th.

## 2024-01-21 ENCOUNTER — Other Ambulatory Visit: Payer: Self-pay | Admitting: Family Medicine

## 2024-01-21 DIAGNOSIS — R7303 Prediabetes: Secondary | ICD-10-CM

## 2024-01-21 MED ORDER — ZEPBOUND 2.5 MG/0.5ML ~~LOC~~ SOAJ: 2.5000 mg | SUBCUTANEOUS | 1 refills | Status: DC

## 2024-01-21 NOTE — Telephone Encounter (Signed)
 Wants glp1

## 2024-01-24 ENCOUNTER — Other Ambulatory Visit (HOSPITAL_COMMUNITY): Payer: Self-pay

## 2024-01-24 ENCOUNTER — Telehealth: Payer: Self-pay | Admitting: Pharmacy Technician

## 2024-01-24 NOTE — Telephone Encounter (Signed)
 Pharmacy Patient Advocate Encounter   Received notification from CoverMyMeds that prior authorization for Zepbound 2.5MG /0.5ML pen-injectors is required/requested.   Insurance verification completed.   The patient is insured through [facility] [place] .   Per test claim: PA required; PA submitted to above mentioned insurance via CoverMyMeds Key/confirmation #/EOC [id number] Status is pending

## 2024-01-24 NOTE — Telephone Encounter (Signed)
 Pharmacy Patient Advocate Encounter  Received notification from [facility] Medicaid that Prior Authorization for Zepbound 2.5MG /0.5ML pen-injectors has been DENIED.  Full denial letter will be uploaded to the media tab. See denial reason below.   PA #/Case ID/Reference #: [id number]

## 2024-01-27 ENCOUNTER — Other Ambulatory Visit (HOSPITAL_COMMUNITY): Payer: Self-pay

## 2024-01-27 ENCOUNTER — Other Ambulatory Visit: Payer: Self-pay | Admitting: Family Medicine

## 2024-01-27 MED ORDER — WEGOVY 0.25 MG/0.5ML ~~LOC~~ SOAJ: 0.2500 mg | SUBCUTANEOUS | 1 refills | Status: DC

## 2024-01-27 NOTE — Telephone Encounter (Signed)
 Melissa Chang is preferred (if we don't have supporting documentation in ov notes, please add just in case)

## 2024-01-28 ENCOUNTER — Telehealth: Payer: Self-pay

## 2024-01-28 ENCOUNTER — Other Ambulatory Visit (HOSPITAL_COMMUNITY): Payer: Self-pay

## 2024-01-28 NOTE — Telephone Encounter (Signed)
 PA request has been Submitted. New Encounter has been or will be created for follow up. For additional info see Pharmacy Prior Auth telephone encounter from 01/28/24.

## 2024-01-28 NOTE — Telephone Encounter (Signed)
 Pharmacy Patient Advocate Encounter   Received notification from Pt Calls Messages that prior authorization for Wegovy 0.25MG /0.5ML auto-injectors is required/requested.   Insurance verification completed.   The patient is insured through [facility] [place] .   Per test claim: PA required; PA submitted to above mentioned insurance via CoverMyMeds Key/confirmation #/EOC [id number] Status is pending

## 2024-01-28 NOTE — Telephone Encounter (Signed)
 Please initiate Wegovy PA.  Last ov notes updated

## 2024-01-28 NOTE — Telephone Encounter (Signed)
 Pharmacy Patient Advocate Encounter  Received notification from [facility] Medicaid that Prior Authorization for Wegovy 0.25MG /0.5ML auto-injectors  has been APPROVED from 01/28/24 to 07/26/24. Ran test claim, Copay is $4.00. This test claim was processed through [facility]- copay amounts may vary at other pharmacies due to pharmacy/plan contracts, or as the patient moves through the different stages of their insurance plan.   PA #/Case ID/Reference #: [id number]

## 2024-02-22 DIAGNOSIS — Z419 Encounter for procedure for purposes other than remedying health state, unspecified: Secondary | ICD-10-CM | POA: Diagnosis not present

## 2024-03-23 DIAGNOSIS — Z419 Encounter for procedure for purposes other than remedying health state, unspecified: Secondary | ICD-10-CM | POA: Diagnosis not present

## 2024-04-08 ENCOUNTER — Ambulatory Visit: Payer: Medicaid Other | Admitting: Family Medicine

## 2024-04-18 ENCOUNTER — Encounter: Payer: Self-pay | Admitting: Family Medicine

## 2024-04-21 ENCOUNTER — Other Ambulatory Visit: Payer: Self-pay | Admitting: Family

## 2024-04-21 MED ORDER — WEGOVY 0.5 MG/0.5ML ~~LOC~~ SOAJ: 0.5000 mg | SUBCUTANEOUS | 1 refills | Status: DC

## 2024-04-23 DIAGNOSIS — Z419 Encounter for procedure for purposes other than remedying health state, unspecified: Secondary | ICD-10-CM | POA: Diagnosis not present

## 2024-05-01 ENCOUNTER — Ambulatory Visit: Admitting: Family Medicine

## 2024-05-23 DIAGNOSIS — Z419 Encounter for procedure for purposes other than remedying health state, unspecified: Secondary | ICD-10-CM | POA: Diagnosis not present

## 2024-06-01 ENCOUNTER — Ambulatory Visit

## 2024-06-01 ENCOUNTER — Ambulatory Visit: Admission: EM | Admit: 2024-06-01 | Discharge: 2024-06-01 | Discharge status: Against Medical Advice

## 2024-06-01 DIAGNOSIS — R21 Rash and other nonspecific skin eruption: Secondary | ICD-10-CM | POA: Diagnosis not present

## 2024-06-02 ENCOUNTER — Ambulatory Visit

## 2024-06-02 DIAGNOSIS — L259 Unspecified contact dermatitis, unspecified cause: Secondary | ICD-10-CM | POA: Diagnosis not present

## 2024-06-04 ENCOUNTER — Other Ambulatory Visit: Payer: Self-pay | Admitting: Family Medicine

## 2024-06-04 ENCOUNTER — Encounter: Payer: Self-pay | Admitting: Family Medicine

## 2024-06-04 MED ORDER — SCOPOLAMINE 1 MG/3DAYS TD PT72: 1.0000 | MEDICATED_PATCH | TRANSDERMAL | 0 refills | Status: AC

## 2024-06-23 DIAGNOSIS — Z419 Encounter for procedure for purposes other than remedying health state, unspecified: Secondary | ICD-10-CM | POA: Diagnosis not present

## 2024-07-08 ENCOUNTER — Other Ambulatory Visit (HOSPITAL_COMMUNITY): Payer: Self-pay

## 2024-07-24 DIAGNOSIS — A64 Unspecified sexually transmitted disease: Secondary | ICD-10-CM | POA: Diagnosis not present

## 2024-07-24 DIAGNOSIS — N760 Acute vaginitis: Secondary | ICD-10-CM | POA: Diagnosis not present

## 2024-07-24 DIAGNOSIS — Z419 Encounter for procedure for purposes other than remedying health state, unspecified: Secondary | ICD-10-CM | POA: Diagnosis not present

## 2024-07-29 ENCOUNTER — Other Ambulatory Visit: Payer: Self-pay

## 2024-07-29 DIAGNOSIS — B009 Herpesviral infection, unspecified: Secondary | ICD-10-CM

## 2024-07-29 MED ORDER — VALACYCLOVIR HCL 1 G PO TABS: 1000.0000 mg | ORAL_TABLET | Freq: Two times a day (BID) | ORAL | 0 refills | Status: AC

## 2024-08-20 ENCOUNTER — Ambulatory Visit: Admitting: Family Medicine

## 2024-08-26 ENCOUNTER — Ambulatory Visit: Payer: Self-pay | Admitting: Family Medicine

## 2024-08-26 ENCOUNTER — Ambulatory Visit: Admitting: Family Medicine

## 2024-08-26 VITALS — BP 134/80 | HR 82 | Temp 97.8°F | Ht 64.0 in | Wt 271.0 lb

## 2024-08-26 DIAGNOSIS — M545 Low back pain, unspecified: Secondary | ICD-10-CM | POA: Diagnosis not present

## 2024-08-26 DIAGNOSIS — R7303 Prediabetes: Secondary | ICD-10-CM | POA: Diagnosis not present

## 2024-08-26 DIAGNOSIS — M25512 Pain in left shoulder: Secondary | ICD-10-CM | POA: Diagnosis not present

## 2024-08-26 DIAGNOSIS — E559 Vitamin D deficiency, unspecified: Secondary | ICD-10-CM | POA: Diagnosis not present

## 2024-08-26 DIAGNOSIS — R296 Repeated falls: Secondary | ICD-10-CM

## 2024-08-26 LAB — COMPREHENSIVE METABOLIC PANEL WITH GFR
ALT: 15 U/L (ref 0–35)
AST: 17 U/L (ref 0–37)
Albumin: 4.3 g/dL (ref 3.5–5.2)
Alkaline Phosphatase: 127 U/L — ABNORMAL HIGH (ref 39–117)
BUN: 11 mg/dL (ref 6–23)
CO2: 26 meq/L (ref 19–32)
Calcium: 9 mg/dL (ref 8.4–10.5)
Chloride: 105 meq/L (ref 96–112)
Creatinine, Ser: 1.03 mg/dL (ref 0.40–1.20)
GFR: 73.19 mL/min (ref >60.00)
Glucose, Bld: 85 mg/dL (ref 70–99)
Potassium: 4.1 meq/L (ref 3.5–5.1)
Sodium: 136 meq/L (ref 135–145)
Total Bilirubin: 0.4 mg/dL (ref 0.2–1.2)
Total Protein: 7.1 g/dL (ref 6.0–8.3)

## 2024-08-26 LAB — CBC WITH DIFFERENTIAL/PLATELET
Basophils Absolute: 0 K/uL (ref 0.0–0.1)
Basophils Relative: 0.2 % (ref 0.0–3.0)
Eosinophils Absolute: 0 K/uL (ref 0.0–0.7)
Eosinophils Relative: 0.4 % (ref 0.0–5.0)
HCT: 43.4 % (ref 36.0–46.0)
Hemoglobin: 14.4 g/dL (ref 12.0–15.0)
Lymphocytes Relative: 22.8 % (ref 12.0–46.0)
Lymphs Abs: 2.4 K/uL (ref 0.7–4.0)
MCHC: 33.1 g/dL (ref 30.0–36.0)
MCV: 88.3 fl (ref 78.0–100.0)
Monocytes Absolute: 0.9 K/uL (ref 0.1–1.0)
Monocytes Relative: 8.3 % (ref 3.0–12.0)
Neutro Abs: 7.1 K/uL (ref 1.4–7.7)
Neutrophils Relative %: 68.3 % (ref 43.0–77.0)
Platelets: 270 K/uL (ref 150.0–400.0)
RBC: 4.92 Mil/uL (ref 3.87–5.11)
RDW: 12.9 % (ref 11.5–15.5)
WBC: 10.4 K/uL (ref 4.0–10.5)

## 2024-08-26 LAB — TSH: TSH: 0.9 u[IU]/mL (ref 0.35–5.50)

## 2024-08-26 LAB — VITAMIN D 25 HYDROXY (VIT D DEFICIENCY, FRACTURES): VITD: 7.68 ng/mL — ABNORMAL LOW (ref 30.00–100.00)

## 2024-08-26 LAB — T4, FREE: Free T4: 0.88 ng/dL (ref 0.60–1.60)

## 2024-08-26 LAB — HEMOGLOBIN A1C: Hgb A1c MFr Bld: 6.3 % (ref 4.6–6.5)

## 2024-08-26 MED ORDER — MELOXICAM 7.5 MG PO TABS: 7.5000 mg | ORAL_TABLET | Freq: Every day | ORAL | 0 refills | Status: AC

## 2024-08-26 MED ORDER — VITAMIN D (ERGOCALCIFEROL) 1.25 MG (50000 UNIT) PO CAPS: 50000.0000 [IU] | ORAL_CAPSULE | ORAL | 1 refills | Status: AC

## 2024-08-26 NOTE — Progress Notes (Signed)
 Subjective:     Patient ID: Melissa Chang, female    DOB: 11/01/1996, 28 y.o.   MRN: [id number]  Chief Complaint  Patient presents with   Fall    Has fallin 2x in the last month.  Hurt left shoulder when she did and still giving her pain    Fall    Discussed the use of AI scribe software for clinical note transcription with the patient, who gave verbal consent to proceed.  History of Present Illness Melissa Chang is a 28 year old female who presents with recent episodes of falling.  Falls - Two episodes of falling in the past three weeks - First fall occurred three weeks ago in the bathroom, resulting in a cut on the head after striking a light switch - No loss of consciousness, dizziness, or vision changes during the first fall; felt slightly hot prior to the event - Second fall occurred two weeks ago on carpeted steps, landing on her back - No dizziness, chest pain, or loss of consciousness during the second fall  Musculoskeletal pain - Persistent lower back pain since the second fall - Pain rated 5/10, reduced to 2/10 with Tylenol  - Pain worsens with prolonged sitting and is relieved by use of a heating pad - Left shoulder pain present without movement restriction, numbness, tingling, or radiation  Gynecologic history - Receives birth control injections every three months - Amenorrhea since initiation of birth control regimen  Endocrine and metabolic history - Prediabetes - Low vitamin D  levels without current supplementation  Constitutional and cardiopulmonary symptoms - No chest pain, palpitations, shortness of breath, nausea, vomiting, diarrhea, or confusion     Health Maintenance Due  Topic Date Due   FOOT EXAM  Never done   OPHTHALMOLOGY EXAM  Never done   Diabetic kidney evaluation - Urine ACR  Never done   Pneumococcal Vaccine (1 of 2 - PCV) Never done   Cervical Cancer Screening (HPV/Pap Cotest)  08/04/2024    Past Medical History:  Diagnosis  Date   Anemia    Depression    Diabetes mellitus without complication (HCC)    Headache(784.0)    Migraine    Obesity    Papilledema    Bilateral   Prediabetes     History reviewed. No pertinent surgical history.  Family History  Problem Relation Age of Onset   Diabetes Mother    Diabetes Sister    Diabetes Maternal Aunt    Hypertension Maternal Grandmother    Diabetes Maternal Grandmother     Social History   Socioeconomic History   Marital status: Single    Spouse name: Not on file   Number of children: 0   Years of education: 12   Highest education level: 12th grade  Occupational History   Occupation: Architectural technologist: [employer]  Tobacco Use   Smoking status: Never   Smokeless tobacco: Never  Vaping Use   Vaping status: Never Used  Substance and Sexual Activity   Alcohol use: Yes    Comment: socially   Drug use: No   Sexual activity: Yes    Partners: Male    Birth control/protection: Injection  Other Topics Concern   Not on file  Social History Narrative   Fun: Sleep, going to the beach   Denies religious beliefs effecting health care.   Denies abuse and feels safe at home.    Social Drivers of Health   Financial Resource Strain: Low Risk  (08/26/2024)  Overall Financial Resource Strain (CARDIA)    Difficulty of Paying Living Expenses: Not hard at all  Food Insecurity: No Food Insecurity (08/26/2024)   Hunger Vital Sign    Worried About Running Out of Food in the Last Year: Never true    Ran Out of Food in the Last Year: Never true  Transportation Needs: No Transportation Needs (08/26/2024)   PRAPARE - Administrator, Civil Service (Medical): No    Lack of Transportation (Non-Medical): No  Physical Activity: Sufficiently Active (08/26/2024)   Exercise Vital Sign    Days of Exercise per Week: 5 days    Minutes of Exercise per Session: 60 min  Stress: No Stress Concern Present (01/09/2024)   [employer] of  Occupational Health - Occupational Stress Questionnaire    Feeling of Stress : Not at all  Social Connections: Moderately Isolated (08/26/2024)   Social Connection and Isolation Panel    Frequency of Communication with Friends and Family: Never    Frequency of Social Gatherings with Friends and Family: Once a week    Attends Religious Services: 1 to 4 times per year    Active Member of [employer] or Organizations: No    Attends Engineer, structural: Not on file    Marital Status: Living with partner  Intimate Partner Violence: Unknown (02/15/2022)   Received from [facility]   HITS    Physically Hurt: Not on file    Insult or Talk Down To: Not on file    Threaten Physical Harm: Not on file    Scream or Curse: Not on file    Outpatient Medications Prior to Visit  Medication Sig Dispense Refill   medroxyPROGESTERone  (DEPO-PROVERA ) 150 MG/ML injection Inject 1 mL (150 mg total) into the muscle every 3 (three) months. 1 mL 3   Semaglutide -Weight Management (WEGOVY ) 0.5 MG/0.5ML SOAJ Inject 0.5 mg into the skin once a week. 2 mL 1   valACYclovir  (VALTREX ) 1000 MG tablet Take 1 tablet (1,000 mg total) by mouth 2 (two) times daily. Take for ten days. 20 tablet 0   Vitamin D , Ergocalciferol , (DRISDOL ) 1.25 MG (50000 UNIT) CAPS capsule Take 1 capsule (50,000 Units total) by mouth every 7 (seven) days. 4 capsule 2   scopolamine  (TRANSDERM-SCOP) 1 MG/3DAYS Place 1 patch (1.5 mg total) onto the skin every 3 (three) days. (Patient not taking: Reported on 08/26/2024) 3 patch 0   metroNIDAZOLE  (FLAGYL ) 500 MG tablet Take 1 tablet (500 mg total) by mouth 2 (two) times daily. (Patient not taking: Reported on 01/20/2024) 14 tablet 0   No facility-administered medications prior to visit.    No Known Allergies  ROS Per HPI    Objective:    Physical Exam Constitutional:      General: She is not in acute distress.    Appearance: She is not ill-appearing.  HENT:     Head: Atraumatic.      Nose: Nose normal.     Mouth/Throat:     Mouth: Mucous membranes are moist.     Pharynx: Oropharynx is clear.  Eyes:     General: No visual field deficit.    Extraocular Movements: Extraocular movements intact.     Conjunctiva/sclera: Conjunctivae normal.     Pupils: Pupils are equal, round, and reactive to light.  Cardiovascular:     Rate and Rhythm: Normal rate and regular rhythm.  Pulmonary:     Effort: Pulmonary effort is normal.     Breath sounds: Normal breath sounds.  Musculoskeletal:     Cervical back: Normal range of motion and neck supple. No tenderness.     Right lower leg: No edema.     Left lower leg: No edema.  Lymphadenopathy:     Cervical: No cervical adenopathy.  Skin:    General: Skin is warm and dry.  Neurological:     General: No focal deficit present.     Mental Status: She is alert and oriented to person, place, and time.     Cranial Nerves: No cranial nerve deficit or facial asymmetry.     Sensory: No sensory deficit.     Motor: No weakness, tremor, abnormal muscle tone or pronator drift.     Coordination: Romberg sign negative. Coordination normal.     Gait: Gait normal.  Psychiatric:        Attention and Perception: Attention normal.        Mood and Affect: Mood normal.        Speech: Speech normal.        Behavior: Behavior normal.        Thought Content: Thought content normal.      BP 134/80   Pulse 82   Temp 97.8 F (36.6 C) (Temporal)   Ht 5' 4 (1.626 m)   Wt 271 lb (122.9 kg)   SpO2 98%   BMI 46.52 kg/m  Wt Readings from Last 3 Encounters:  08/26/24 271 lb (122.9 kg)  01/10/24 268 lb (121.6 kg)  01/07/24 267 lb (121.1 kg)       Assessment & Plan:   Problem List Items Addressed This Visit   None Visit Diagnoses       Acute bilateral low back pain without sciatica    -  Primary   Relevant Medications   meloxicam (MOBIC) 7.5 MG tablet     Falls       Relevant Orders   CBC with Differential/Platelet (Completed)    Comprehensive metabolic panel with GFR (Completed)   TSH (Completed)   T4, free (Completed)     Vitamin D  deficiency       Relevant Medications   Vitamin D , Ergocalciferol , (DRISDOL ) 1.25 MG (50000 UNIT) CAPS capsule   Other Relevant Orders   VITAMIN D  25 Hydroxy (Vit-D Deficiency, Fractures) (Completed)     Prediabetes       Relevant Orders   CBC with Differential/Platelet (Completed)   Comprehensive metabolic panel with GFR (Completed)   Hemoglobin A1c (Completed)   TSH (Completed)   T4, free (Completed)     Acute pain of left shoulder           Assessment and Plan Assessment & Plan History of falls Two falls in recent weeks: one in the bathroom causing a head laceration (healed), and another on carpeted stairs resulting in low back pain. No dizziness, chest pain, or neurological symptoms. No clear etiology identified. Previous MRI and spinal tap in 2014 were normal. - Perform blood work to check vitamin D  levels and other potential causes  Low back pain and left shoulder pain secondary to recent falls Moderate low back pain (5/10) improving to 2/10 with Tylenol , exacerbated by prolonged sitting. No radiation. Left shoulder pain present but allows movement. - Prescribe meloxicam for musculoskeletal pain for a couple of weeks - Continue using over-the-counter Tylenol  as needed - Avoid ibuprofen, Aleve, Advil while on meloxicam - Consider referral to sports medicine for ultrasound if pain persists or worsens  Vitamin D  deficiency Vitamin D  deficiency noted in February  with low levels. Currently not taking supplements due to needing a refill. - Order blood work to check current vitamin D  levels - Prescribe vitamin D  supplement if levels are low  Prediabetes Prediabetes with no recent monitoring. - Check A1c levels during blood work to monitor prediabetes status  Contraceptive management (injectable) On injectable birth control every three months. Amenorrhea since starting.  No chance of pregnancy reported.     I have discontinued Melissa Chang's metroNIDAZOLE . I am also having her start on meloxicam. Additionally, I am having her maintain her medroxyPROGESTERone , Wegovy , scopolamine , valACYclovir , and Vitamin D  (Ergocalciferol ).  Meds ordered this encounter  Medications   meloxicam (MOBIC) 7.5 MG tablet    Sig: Take 1 tablet (7.5 mg total) by mouth daily.    Dispense:  14 tablet    Refill:  0    Supervising Provider:   ROLLENE NORRIS A [[id number]]   Vitamin D , Ergocalciferol , (DRISDOL ) 1.25 MG (50000 UNIT) CAPS capsule    Sig: Take 1 capsule (50,000 Units total) by mouth every 7 (seven) days.    Dispense:  6 capsule    Refill:  1    Supervising Provider:   ROLLENE NORRIS A [[id number]]

## 2024-09-04 ENCOUNTER — Encounter: Payer: Self-pay | Admitting: Family Medicine

## 2024-09-04 MED ORDER — WEGOVY 1 MG/0.5ML ~~LOC~~ SOAJ: 1.0000 mg | SUBCUTANEOUS | 0 refills | Status: AC

## 2024-09-04 NOTE — Telephone Encounter (Signed)
 Pt requesting increased dose, I will advise on insurance discontinuation of coverage if sent

## 2024-09-08 ENCOUNTER — Telehealth: Payer: Self-pay

## 2024-09-08 NOTE — Telephone Encounter (Signed)
 Pharmacy Patient Advocate Encounter   Received notification from CoverMyMeds that prior authorization for Wegovy  1MG /0.5ML auto-injectors is required/requested.   Insurance verification completed.   The patient is insured through [facility] MEDICAID.   Per test claim: Effective October 1st, Medicaid will discontinue coverage of GLP1 medications for weight loss (such as Wegovy  and Zepbound ), unless the patient has a documented history of a heart attack or stroke. Zepbound  will continue to be covered only for patients with moderate to severe sleep apnea (AHI 15-30) and a BMI greater than 40. Because of this change, the prior authorization team will not be submitting new PA requests for GLP1 medications prescribed for weight loss, as patients will be unable to continue therapy under Medicaid coverage.

## 2024-09-23 DIAGNOSIS — Z419 Encounter for procedure for purposes other than remedying health state, unspecified: Secondary | ICD-10-CM | POA: Diagnosis not present

## 2024-09-25 ENCOUNTER — Ambulatory Visit: Admitting: Family Medicine

## 2024-10-01 ENCOUNTER — Ambulatory Visit

## 2024-10-06 ENCOUNTER — Ambulatory Visit

## 2024-10-22 ENCOUNTER — Other Ambulatory Visit (HOSPITAL_COMMUNITY)
Admission: RE | Admit: 2024-10-22 | Discharge: 2024-10-22 | Disposition: A | Source: Ambulatory Visit | Attending: Obstetrics & Gynecology | Admitting: Obstetrics & Gynecology

## 2024-10-22 ENCOUNTER — Ambulatory Visit (INDEPENDENT_AMBULATORY_CARE_PROVIDER_SITE_OTHER)

## 2024-10-22 VITALS — BP 131/85 | HR 87

## 2024-10-22 DIAGNOSIS — N898 Other specified noninflammatory disorders of vagina: Secondary | ICD-10-CM

## 2024-10-22 NOTE — Progress Notes (Signed)
..  SUBJECTIVE:  28 y.o. female complains of thick vaginal discharge, odor, and irritation for 1.5 week(s). Denies abnormal vaginal bleeding or significant pelvic pain or fever. No UTI symptoms. Denies history of known exposure to STD.  No LMP recorded. Patient has had an injection.  OBJECTIVE:  She appears well, afebrile. Urine dipstick: not done.  ASSESSMENT:  Vaginal Discharge  Vaginal Odor/irritation   PLAN:  GC, chlamydia, trichomonas, BVAG, CVAG probe sent to lab. Treatment: To be determined once lab results are received ROV prn if symptoms persist or worsen.

## 2024-10-23 DIAGNOSIS — Z419 Encounter for procedure for purposes other than remedying health state, unspecified: Secondary | ICD-10-CM | POA: Diagnosis not present

## 2024-10-23 LAB — CERVICOVAGINAL ANCILLARY ONLY
Bacterial Vaginitis (gardnerella): NEGATIVE
Candida Glabrata: NEGATIVE
Candida Vaginitis: NEGATIVE
Chlamydia: NEGATIVE
Comment: NEGATIVE
Comment: NEGATIVE
Comment: NEGATIVE
Comment: NEGATIVE
Comment: NEGATIVE
Comment: NORMAL
Neisseria Gonorrhea: NEGATIVE
Trichomonas: NEGATIVE
